# Patient Record
Sex: Male | Born: 1985
Health system: Southern US, Community
[De-identification: ages and names within clinical notes are randomized; demographics above are authoritative.]

## PROBLEM LIST (undated history)

## (undated) DIAGNOSIS — M899 Disorder of bone, unspecified: Secondary | ICD-10-CM

## (undated) HISTORY — DX: Disorder of bone, unspecified: M89.9

---

## 2014-07-22 ENCOUNTER — Ambulatory Visit (INDEPENDENT_AMBULATORY_CARE_PROVIDER_SITE_OTHER): Payer: BC Managed Care – PPO | Admitting: Family

## 2014-07-22 ENCOUNTER — Encounter: Payer: Self-pay | Admitting: Family

## 2014-07-22 VITALS — BP 118/78 | HR 57 | Temp 97.7°F | Resp 18 | Ht 72.0 in | Wt 189.0 lb

## 2014-07-22 DIAGNOSIS — Z0000 Encounter for general adult medical examination without abnormal findings: Secondary | ICD-10-CM

## 2014-07-22 DIAGNOSIS — Z23 Encounter for immunization: Secondary | ICD-10-CM | POA: Diagnosis not present

## 2014-07-22 NOTE — Patient Instructions (Signed)
Thank you for choosing [facility].  Summary/Instructions:   You have completed your annual physical.  Please stop by the lab when fasting for blood work.  Please schedule a physical for 1 year.   Return sooner if needed

## 2014-07-22 NOTE — Assessment & Plan Note (Signed)
1) Anticipatory Guidance: Discussed importance of changing batteries in smoke detector; wearing suntan lotion when being outside, wearing a seatbelt in the car and not texting while driving.  2) Immunizations / Screenings / Labs:  Flu shot given. All other immunizations are up to date / all screening are up to date / Ordered CBC, BMET, Lipid profile and TSH.   Normal well exam. Follow up in 1 year for next exam or sooner if needed.

## 2014-07-22 NOTE — Progress Notes (Signed)
Subjective:    Patient ID: Richard Cantrell Corea, male    DOB: 27-May-1986, 27 y.o.   MRN: [id number]  No chief complaint on file.   HPI:  Richard Cantrell Levey is a 28 y.o. male who presents today for to establish care and for a physical.  1) Health maintenance  Dental -- Has not been since 2011 [facility] -- Does not recall Immunizations -- Last tetanus shot was this year; would like flu shot; all childhood immunizations are up to date.  Diet - Tries to stay balanced with fruits and vegetables and protein. Eats 3 meals per day.   Allergies  Allergen Reactions  . Amoxicillin Hives   No current outpatient prescriptions on file prior to visit.   No current facility-administered medications on file prior to visit.   Family History  Problem Relation Age of Onset  . Heart attack Father   . Hypertension Father   . Diabetes Maternal Grandfather   . Prostate cancer Maternal Grandfather   . Diabetes Paternal Grandmother   . Heart attack Paternal Grandfather    History   Social History  . Marital Status: Single    Spouse Name: N/A    Number of Children: 0  . Years of Education: 17   Occupational History  . Graduate Assistantship Other    [employer]   Social History Main Topics  . Smoking status: Never Smoker   . Smokeless tobacco: Never Used  . Alcohol Use: Yes     Comment: infrequently  . Drug Use: No  . Sexual Activity: Not on file   Other Topics Concern  . Not on file   Social History Narrative   [place] Lives in an apartment by self. No pets.   Fun: Backpack and camp   Denies any religious beliefs effecting healthcare.     Review of Systems  Constitutional: Denies fever, chills, fatigue, or significant weight gain/loss. HENT: Head: Denies headache or neck pain Ears: Denies changes in hearing, ringing in ears, earache, drainage Nose: Denies discharge, stuffiness, itching, nosebleed, sinus pain Throat: Denies sore throat,  hoarseness, dry mouth, sores, thrush Eyes: Denies loss/changes in vision, pain, redness, blurry/double vision, flashing lights Cardiovascular: Denies chest pain/discomfort, tightness, palpitations, shortness of breath with activity, difficulty lying down, swelling, sudden awakening with shortness of breath Respiratory: Denies shortness of breath, cough, sputum production, wheezing Gastrointestinal: Denies dysphasia, heartburn, change in appetite, nausea, change in bowel habits, rectal bleeding, constipation, diarrhea, yellow skin or eyes Genitourinary: Denies frequency, urgency, burning/pain, blood in urine, incontinence, change in urinary strength. Musculoskeletal: Denies muscle/joint pain, stiffness, back pain, redness or swelling of joints, trauma Shoulder issue for the past few months. Unaware of any trauma - believes it could be strain.  Skin: Denies rashes, lumps, itching, dryness, color changes, or hair/nail changes Neurological: Denies dizziness, fainting, seizures, weakness, numbness, tingling, tremor Psychiatric - Denies nervousness, stress, depression or memory loss    Objective:    BP 118/78  Pulse 57  Temp(Src) 97.7 F (36.5 C)  Resp 18  Ht 6' (1.829 m)  Wt 189 lb (85.73 kg)  BMI 25.63 kg/m2  SpO2 98% Nursing note and vital signs reviewed.  Physical Exam  Constitutional: He is oriented to person, place, and time. He appears well-developed and well-nourished. No distress.  Eyes: Conjunctivae and EOM are normal. Pupils are equal, round, and reactive to light.  Neck: Normal range of motion. Neck supple. No JVD present. No tracheal deviation present. No thyromegaly present.  Cardiovascular: Normal rate, normal heart sounds and intact distal pulses.   Pulmonary/Chest: Effort normal and breath sounds normal.  Abdominal: Soft. Bowel sounds are normal. He exhibits no distension and no mass. There is no tenderness. There is no rebound and no guarding.  Musculoskeletal: Normal  range of motion.  Lymphadenopathy:    He has no cervical adenopathy.  Neurological: He is alert and oriented to person, place, and time. He has normal reflexes. No cranial nerve deficit. Coordination normal.  Skin: Skin is warm and dry.  Psychiatric: He has a normal mood and affect. His behavior is normal. Judgment and thought content normal.       Assessment & Plan:

## 2014-08-15 ENCOUNTER — Telehealth: Payer: Self-pay | Admitting: Family

## 2014-08-15 ENCOUNTER — Other Ambulatory Visit (INDEPENDENT_AMBULATORY_CARE_PROVIDER_SITE_OTHER): Payer: BC Managed Care – PPO

## 2014-08-15 DIAGNOSIS — Z0000 Encounter for general adult medical examination without abnormal findings: Secondary | ICD-10-CM

## 2014-08-15 LAB — BASIC METABOLIC PANEL
BUN: 24 mg/dL — ABNORMAL HIGH (ref 6–23)
CO2: 30 mEq/L (ref 19–32)
Calcium: 9.8 mg/dL (ref 8.4–10.5)
Chloride: 103 mEq/L (ref 96–112)
Creatinine, Ser: 1.3 mg/dL (ref 0.4–1.5)
GFR: 73.45 mL/min (ref >60.00)
Glucose, Bld: 95 mg/dL (ref 70–99)
Potassium: 4.3 mEq/L (ref 3.5–5.1)
Sodium: 139 mEq/L (ref 135–145)

## 2014-08-15 LAB — LIPID PANEL
Cholesterol: 134 mg/dL (ref 0–200)
HDL: 49 mg/dL (ref >39.00)
LDL Cholesterol: 71 mg/dL (ref 0–99)
NonHDL: 85
Total CHOL/HDL Ratio: 3
Triglycerides: 70 mg/dL (ref 0.0–149.0)
VLDL: 14 mg/dL (ref 0.0–40.0)

## 2014-08-15 LAB — CBC
HCT: 46 % (ref 39.0–52.0)
Hemoglobin: 15.3 g/dL (ref 13.0–17.0)
MCHC: 33.2 g/dL (ref 30.0–36.0)
MCV: 90.9 fl (ref 78.0–100.0)
Platelets: 166 10*3/uL (ref 150.0–400.0)
RBC: 5.07 Mil/uL (ref 4.22–5.81)
RDW: 13.1 % (ref 11.5–15.5)
WBC: 4.9 10*3/uL (ref 4.0–10.5)

## 2014-08-15 LAB — TSH: TSH: 3.41 u[IU]/mL (ref 0.35–4.50)

## 2014-08-15 NOTE — Telephone Encounter (Signed)
Called pt to let him know that labs were normal. Pt understood.

## 2014-08-15 NOTE — Telephone Encounter (Signed)
Please call patient and inform him that his labs are all within the expected ranges. There are no changes that need to be made at this time.

## 2015-02-13 ENCOUNTER — Encounter: Payer: Self-pay | Admitting: Family

## 2015-02-13 ENCOUNTER — Ambulatory Visit (INDEPENDENT_AMBULATORY_CARE_PROVIDER_SITE_OTHER): Payer: BLUE CROSS/BLUE SHIELD | Admitting: Family

## 2015-02-13 VITALS — BP 132/72 | HR 82 | Temp 97.8°F | Resp 18 | Ht 72.0 in | Wt 191.0 lb

## 2015-02-13 DIAGNOSIS — M25512 Pain in left shoulder: Secondary | ICD-10-CM | POA: Diagnosis not present

## 2015-02-13 NOTE — Patient Instructions (Signed)
Thank you for choosing [facility].  Summary/Instructions:   Please stop by radiology on the basement level of the building for your x-rays. Your results will be released to MyChart (or called to you) after review, usually within 72 hours after test completion. If any treatments or changes are necessary, you will be notified at that same time.  Referrals have been made during this visit. You should expect to hear back from our schedulers in about 7-10 days in regards to establishing an appointment with the specialists we discussed.   If your symptoms worsen or fail to improve, please contact our office for further instruction, or in case of emergency go directly to the emergency room at the closest medical facility.   Ice as needed.  Continue ibuprofen as needed.   Follow up if symptoms worsen.

## 2015-02-13 NOTE — Assessment & Plan Note (Signed)
Left shoulder discomfort and occasional upper extremity numbness of undetermined cause. Obtain cervical x-rays to rule out cervical spine nerve entrapment. Continue conservative treatment with ice, stretching, and over-the-counter ibuprofen as needed for discomfort. Refer to sports medicine for further evaluation.

## 2015-02-13 NOTE — Progress Notes (Signed)
Pre visit review using our clinic review tool, if applicable. No additional management support is needed unless otherwise documented below in the visit note. 

## 2015-02-13 NOTE — Progress Notes (Signed)
   Subjective:    Patient ID: Richard Cantrell, male    DOB: 04/21/1986, 29 y.o.   MRN: [id number]  Chief Complaint  Patient presents with  . Shoulder Pain    left shoulder pain x1 year and a half, doesn't remember doing anything strenuous to cause it, restricted ROM    HPI:  Richard Cantrell is a 29 y.o. male with no significant PMH who presents today for an acute office visit.  Associated symptom of pain and limited range of motion located in his left shoulder has been going on for approximately 1-1/2 years. Denies trauma or any significant event that started his pain. Describes that it feels like the movement feels restricted. Symptoms are exacerbated after approximately 15-20 minutes of upper extremity resistance training indicating that he feels weaker and sometimes numb and tingling. Severity is 0/10 and is not enough to effect his current lifestyle. Modifying factors include ibuprofen which does not help a lot.   Allergies  Allergen Reactions  . Amoxicillin Hives    No current outpatient prescriptions on file prior to visit.   No current facility-administered medications on file prior to visit.    Review of Systems  Constitutional: Negative for fever and chills.  Musculoskeletal:       Positive for shoulder discomfort.   Neurological: Positive for numbness.      Objective:    BP 132/72 mmHg  Pulse 82  Temp(Src) 97.8 F (36.6 C) (Oral)  Resp 18  Ht 6' (1.829 m)  Wt 191 lb (86.637 kg)  BMI 25.90 kg/m2  SpO2 99% Nursing note and vital signs reviewed.  Physical Exam  Constitutional: He is oriented to person, place, and time. He appears well-developed and well-nourished. No distress.  Cardiovascular: Normal rate, regular rhythm, normal heart sounds and intact distal pulses.   Pulmonary/Chest: Effort normal and breath sounds normal.  Musculoskeletal:  No obvious deformity, discoloration, or edema of left shoulder noted. Mild tenderness elicited anterior to  supraspinatus insertion. Displays full range of motion in all directions. Apley scratch test is negative. Apprehension is negative. Empty can is negative. Distal pulses and sensation are intact and appropriate.  Neurological: He is alert and oriented to person, place, and time.  Skin: Skin is warm and dry.  Psychiatric: He has a normal mood and affect. His behavior is normal. Judgment and thought content normal.       Assessment & Plan:

## 2015-02-25 ENCOUNTER — Ambulatory Visit (INDEPENDENT_AMBULATORY_CARE_PROVIDER_SITE_OTHER): Payer: BLUE CROSS/BLUE SHIELD | Admitting: Family Medicine

## 2015-02-25 ENCOUNTER — Ambulatory Visit (INDEPENDENT_AMBULATORY_CARE_PROVIDER_SITE_OTHER): Payer: BLUE CROSS/BLUE SHIELD

## 2015-02-25 ENCOUNTER — Encounter: Payer: Self-pay | Admitting: Family Medicine

## 2015-02-25 VITALS — BP 112/72 | HR 67 | Ht 72.0 in | Wt 188.0 lb

## 2015-02-25 DIAGNOSIS — M25512 Pain in left shoulder: Secondary | ICD-10-CM

## 2015-02-25 DIAGNOSIS — M67912 Unspecified disorder of synovium and tendon, left shoulder: Secondary | ICD-10-CM

## 2015-02-25 DIAGNOSIS — M67919 Unspecified disorder of synovium and tendon, unspecified shoulder: Secondary | ICD-10-CM | POA: Insufficient documentation

## 2015-02-25 MED ORDER — MELOXICAM 15 MG PO TABS: 15.0000 mg | ORAL_TABLET | Freq: Every day | ORAL | Status: DC

## 2015-02-25 NOTE — Assessment & Plan Note (Signed)
Patient does have more of a tendinopathy noted of the rotator cuff. Patient work with Event organiserathletic trainer today to learn home exercises in greater detail. Patient is going try conservative therapy with exercises, icing, topical anti-inflammatory's as well as a short course of oral anti-rheumatoid. Patient and will come back and see me again in 3-4 weeks. If any worsening symptoms he'll come back sooner and do not feel that further imaging is necessary at this time.

## 2015-02-25 NOTE — Progress Notes (Signed)
[facility] [address] [place], [place] [address] Phone: [phone] Subjective:    I'm seeing this patient by the request  of:  Jeanine Luz, FNP   CC: Left shoulder pain  BJY:NWGNFAOZHY Richard Cantrell is a 29 y.o. male coming in with complaint of left shoulder pain. Patient states he is had this insidious onset over the course of several weeks. Patient does not remember any true injury. Patient does work out on a regular basis. Patient denies any radiation of pain and states that it seems to be localized more on the posterior lateral aspect of the shoulder. Hurts more with overhead activity as well as reaching behind his back. Denies any significant weakness. Denies any nighttime awakening. Patient is concerned because it has not gotten any better and if anything is worsening. Has tried over-the-counter anti-inflammatory's with minimal benefit. States that this can sometimes give him pain that this is very mild rates the severity of pain a 6 out of 10. Nothing that stops him from his daily activities.    No past medical history on file. No past surgical history on file. History  Substance Use Topics  . Smoking status: Never Smoker   . Smokeless tobacco: Never Used  . Alcohol Use: Yes     Comment: infrequently   Allergies  Allergen Reactions  . Amoxicillin Hives   Family History  Problem Relation Age of Onset  . Heart attack Father   . Hypertension Father   . Diabetes Maternal Grandfather   . Prostate cancer Maternal Grandfather   . Diabetes Paternal Grandmother   . Heart attack Paternal Grandfather      Past medical history, social, surgical and family history all reviewed in electronic medical record.   Review of Systems: No headache, visual changes, nausea, vomiting, diarrhea, constipation, dizziness, abdominal pain, skin rash, fevers, chills, night sweats, weight loss, swollen lymph nodes, body aches, joint swelling, muscle aches,  chest pain, shortness of breath, mood changes.   Objective Blood pressure 112/72, pulse 67, height 6' (1.829 m), weight 188 lb (85.276 kg), SpO2 99 %.  General: No apparent distress alert and oriented x3 mood and affect normal, dressed appropriately.  HEENT: Pupils equal, extraocular movements intact  Respiratory: Patient's speak in full sentences and does not appear short of breath  Cardiovascular: No lower extremity edema, non tender, no erythema  Skin: Warm dry intact with no signs of infection or rash on extremities or on axial skeleton.  Abdomen: Soft nontender  Neuro: Cranial nerves II through XII are intact, neurovascularly intact in all extremities with 2+ DTRs and 2+ pulses.  Lymph: No lymphadenopathy of posterior or anterior cervical chain or axillae bilaterally.  Gait normal with good balance and coordination.  MSK:  Non tender with full range of motion and good stability and symmetric strength and tone of  elbows, wrist, hip, knee and ankles bilaterally.  Shoulder: left Inspection reveals no abnormalities, atrophy or asymmetry. Palpation is normal with no tenderness over AC joint or bicipital groove. ROM is full in all planes passively. Rotator cuff strength normal throughout. signs of impingement with positive Neer and Hawkin's tests, but negative empty can sign. Speeds and Yergason's tests normal. No labral pathology noted with negative Obrien's, negative clunk and good stability. Normal scapular function observed. No painful arc and no drop arm sign. No apprehension sign  MSK US performed of: left This study was ordered, performed, and interpreted by Terrilee Files D.O.  Shoulder:   Supraspinatus:  Appears normal on long and transverse views, Bursal bulge seen with shoulder abduction on impingement view. Increasing Doppler flow at insertion of the rotator cuff Infraspinatus:  Appears normal on long and transverse views. Significant increase in Doppler flow Subscapularis:   Appears normal on long and transverse views. Positive bursa Teres Minor:  Appears normal on long and transverse views. AC joint:  Capsule undistended, no geyser sign. Glenohumeral Joint:  Appears normal without effusion. Glenoid Labrum:  Intact without visualized tears. Biceps Tendon:  Appears normal on long and transverse views, no fraying of tendon, tendon located in intertubercular groove, no subluxation with shoulder internal or external rotation.  Impression: Mild Subacromial bursitis with rotator cuff tendinopathy   Procedure note 97110; 15 minutes spent for Therapeutic exercises as stated in above notes.  This included exercises focusing on stretching, strengthening, with significant focus on eccentric aspects.  - Shoulder Exercises that included:  Basic scapular stabilization to include adduction and depression of scapula Scaption, focusing on proper movement and good control Internal and External rotation utilizing a theraband, with elbow tucked at side entire time Rows with theraband Proper technique shown and discussed handout in great detail with ATC.  All questions were discussed and answered.      Impression and Recommendations:     This case required medical decision making of moderate complexity.

## 2015-02-25 NOTE — Progress Notes (Signed)
Pre visit review using our clinic review tool, if applicable. No additional management support is needed unless otherwise documented below in the visit note. 

## 2015-02-25 NOTE — Patient Instructions (Signed)
Good to see you Ice 20 minutes 2 times daily. Usually after activity and before bed. Exercises 3 times a week.  pennsaid pinkie amount topically 2 times daily as needed.  Meloxicam daily for 10 days then as needed Avoid overhead lifting until I see you again Tennis ball between shoulder blades with a lot of sitting See me again in 3 weeks.

## 2015-03-18 ENCOUNTER — Ambulatory Visit (INDEPENDENT_AMBULATORY_CARE_PROVIDER_SITE_OTHER): Payer: BLUE CROSS/BLUE SHIELD | Admitting: Family Medicine

## 2015-03-18 ENCOUNTER — Other Ambulatory Visit (INDEPENDENT_AMBULATORY_CARE_PROVIDER_SITE_OTHER): Payer: BLUE CROSS/BLUE SHIELD

## 2015-03-18 ENCOUNTER — Encounter: Payer: Self-pay | Admitting: Family Medicine

## 2015-03-18 ENCOUNTER — Ambulatory Visit (INDEPENDENT_AMBULATORY_CARE_PROVIDER_SITE_OTHER)
Admission: RE | Admit: 2015-03-18 | Discharge: 2015-03-18 | Disposition: A | Discharge status: Home / Self Care | Payer: BLUE CROSS/BLUE SHIELD | Source: Ambulatory Visit | Attending: Family Medicine | Admitting: Family Medicine

## 2015-03-18 VITALS — BP 106/72 | HR 70 | Wt 188.0 lb

## 2015-03-18 DIAGNOSIS — M25512 Pain in left shoulder: Secondary | ICD-10-CM

## 2015-03-18 DIAGNOSIS — M67912 Unspecified disorder of synovium and tendon, left shoulder: Secondary | ICD-10-CM

## 2015-03-18 NOTE — Assessment & Plan Note (Addendum)
Patient was given an injection today. We discussed icing regimen and home exercises. We discussed what activities to potentially avoid. Patient will actually call back in one week and if continuing have pain I would consider an MRI due to the worsening symptoms and failing conservative therapy.  Spent  25 minutes with patient face-to-face and had greater than 50% of counseling including as described above in assessment and plan.

## 2015-03-18 NOTE — Patient Instructions (Signed)
Good to see you Sorry for no improvement Lets get xray downstairs today Continue the exercises regularly Avoid lifting where hands are out of periphery or overhead.  Give it 7-10 days if not a lot better call and we will order MRI.  Then I would want to see you 1-2 days after.

## 2015-03-18 NOTE — Progress Notes (Signed)
[facility] [address] [place], [place] [address] Phone: [phone] Subjective:    I'm seeing this patient by the request  of:  Jeanine Luz, FNP   CC: Left shoulder pain  BJY:NWGNFAOZHY Richard Cantrell is a 29 y.o. male coming in with complaint of left shoulder pain. She was found to have a very minimal subacromial bursitis as well as a tendinopathy of the rotator cuff. There is no signs of any and internal or intra-articular derangement. Patient was given home exercises, icing protocol, and oral anti-inflammatory's. Patient states that he is not made any significant improvement at this time. An states if anything it seems to be worsening. States that he is only lifting weights one time a week. States that he can be uncomfortable and falling asleep at night as well. Noticing more popping than can be somewhat uncomfortable but would not consider painful.    No past medical history on file. No past surgical history on file. History  Substance Use Topics  . Smoking status: Never Smoker   . Smokeless tobacco: Never Used  . Alcohol Use: Yes     Comment: infrequently   Allergies  Allergen Reactions  . Amoxicillin Hives   Family History  Problem Relation Age of Onset  . Heart attack Father   . Hypertension Father   . Diabetes Maternal Grandfather   . Prostate cancer Maternal Grandfather   . Diabetes Paternal Grandmother   . Heart attack Paternal Grandfather      Past medical history, social, surgical and family history all reviewed in electronic medical record.   Review of Systems: No headache, visual changes, nausea, vomiting, diarrhea, constipation, dizziness, abdominal pain, skin rash, fevers, chills, night sweats, weight loss, swollen lymph nodes, body aches, joint swelling, muscle aches, chest pain, shortness of breath, mood changes.   Objective Blood pressure 106/72, pulse 70, weight 188 lb (85.276 kg), SpO2 98 %.  General: No  apparent distress alert and oriented x3 mood and affect normal, dressed appropriately.  HEENT: Pupils equal, extraocular movements intact  Respiratory: Patient's speak in full sentences and does not appear short of breath  Cardiovascular: No lower extremity edema, non tender, no erythema  Skin: Warm dry intact with no signs of infection or rash on extremities or on axial skeleton.  Abdomen: Soft nontender  Neuro: Cranial nerves II through XII are intact, neurovascularly intact in all extremities with 2+ DTRs and 2+ pulses.  Lymph: No lymphadenopathy of posterior or anterior cervical chain or axillae bilaterally.  Gait normal with good balance and coordination.  MSK:  Non tender with full range of motion and good stability and symmetric strength and tone of  elbows, wrist, hip, knee and ankles bilaterally.  Shoulder: left Inspection reveals no abnormalities, atrophy or asymmetry. Palpation is normal with no tenderness over AC joint or bicipital groove. ROM is full in all planes passively. Rotator cuff strength normal throughout. signs of impingement with positive Neer and Hawkin's tests, but negative empty can sign. Speeds and Yergason's tests normal. No labral pathology noted with negative Obrien's, negative clunk and good stability. Normal scapular function observed. No painful arc and no drop arm sign. No apprehension sign No change from previous exam Contralateral shoulder unremarkable  Procedure: Real-time Ultrasound Guided Injection of left glenohumeral joint Device: GE Logiq E  Ultrasound guided injection is preferred based studies that show increased duration, increased effect, greater accuracy, decreased procedural pain, increased response rate with ultrasound guided versus blind injection.  Verbal informed  consent obtained.  Time-out conducted.  Noted no overlying erythema, induration, or other signs of local infection.  Skin prepped in a sterile fashion.  Local anesthesia:  Topical Ethyl chloride.  With sterile technique and under real time ultrasound guidance:  Joint visualized.  23g 1  inch needle inserted posterior approach. Pictures taken for needle placement. Patient did have injection of 2 cc of 1% lidocaine, 2 cc of 0.5% Marcaine, and 1cc of Kenalog 40 mg/dL. Completed without difficulty  Pain immediately resolved suggesting accurate placement of the medication.  Advised to call if fevers/chills, erythema, induration, drainage, or persistent bleeding.  Images permanently stored and available for review in the ultrasound unit.  Impression: Technically successful ultrasound guided injection.      Impression and Recommendations:     This case required medical decision making of moderate complexity.

## 2015-03-27 ENCOUNTER — Telehealth: Payer: Self-pay | Admitting: Family Medicine

## 2015-03-27 NOTE — Telephone Encounter (Signed)
Please call patient with his xray results

## 2015-03-27 NOTE — Telephone Encounter (Signed)
Left detailed msg on pt's vmail advising him x ray results are normal.

## 2015-04-02 ENCOUNTER — Telehealth: Payer: Self-pay | Admitting: Family

## 2015-04-02 DIAGNOSIS — M25512 Pain in left shoulder: Secondary | ICD-10-CM

## 2015-04-02 NOTE — Telephone Encounter (Signed)
Please call patient. He still is not feeling any better

## 2015-04-02 NOTE — Telephone Encounter (Signed)
Spoke to pt, entered or for MR Arthrogram.

## 2015-04-15 ENCOUNTER — Ambulatory Visit
Admission: RE | Admit: 2015-04-15 | Discharge: 2015-04-15 | Disposition: A | Discharge status: Home / Self Care | Payer: BLUE CROSS/BLUE SHIELD | Source: Ambulatory Visit | Attending: Family Medicine | Admitting: Family Medicine

## 2015-04-15 DIAGNOSIS — M25512 Pain in left shoulder: Secondary | ICD-10-CM

## 2015-04-15 MED ORDER — IOHEXOL 180 MG/ML  SOLN: 15.0000 mL | Freq: Once | INTRAMUSCULAR | Status: DC | PRN

## 2015-04-15 MED ORDER — IOHEXOL 180 MG/ML  SOLN
15.0000 mL | Freq: Once | INTRAMUSCULAR | Status: AC | PRN
  Administered 2015-04-15: 15 mL via INTRA_ARTICULAR

## 2015-04-30 ENCOUNTER — Ambulatory Visit (INDEPENDENT_AMBULATORY_CARE_PROVIDER_SITE_OTHER): Payer: BLUE CROSS/BLUE SHIELD | Admitting: Family Medicine

## 2015-04-30 ENCOUNTER — Encounter: Payer: Self-pay | Admitting: Family Medicine

## 2015-04-30 VITALS — BP 114/82 | HR 60 | Ht 72.0 in | Wt 187.0 lb

## 2015-04-30 DIAGNOSIS — M9908 Segmental and somatic dysfunction of rib cage: Secondary | ICD-10-CM | POA: Diagnosis not present

## 2015-04-30 DIAGNOSIS — M899 Disorder of bone, unspecified: Secondary | ICD-10-CM | POA: Diagnosis not present

## 2015-04-30 DIAGNOSIS — M9902 Segmental and somatic dysfunction of thoracic region: Secondary | ICD-10-CM | POA: Diagnosis not present

## 2015-04-30 DIAGNOSIS — M9901 Segmental and somatic dysfunction of cervical region: Secondary | ICD-10-CM

## 2015-04-30 DIAGNOSIS — M999 Biomechanical lesion, unspecified: Secondary | ICD-10-CM | POA: Insufficient documentation

## 2015-04-30 NOTE — Progress Notes (Signed)
[facility] [address] [place], [place] [address] Phone: [phone] Subjective:    I'm seeing this patient by the request  of:  Jeanine Luz, FNP   CC: Left shoulder pain  BJY:NWGNFAOZHY Richard Cantrell is a 29 y.o. male coming in with complaint of left shoulder pain. She was found to have a very minimal subacromial bursitis as well as a tendinopathy of the rotator cuff seen on ultrasound. Patient's elected to try an injection which did not give him any significant relief. We ordered an MRI. MRI was reviewed by me and was completely unremarkable. Patient states he continues to have the same pain. Seems to still be in the left shoulder region. Patient states that it is still very uncomfortable. Still able to do daily activities but is very difficulty to follow sleep at night because of this pain. Denies any significant radiation or weakness of the upper extremity. Denies any associated neck pain.    No past medical history on file. No past surgical history on file. History  Substance Use Topics  . Smoking status: Never Smoker   . Smokeless tobacco: Never Used  . Alcohol Use: Yes     Comment: infrequently   Allergies  Allergen Reactions  . Amoxicillin Hives   Family History  Problem Relation Age of Onset  . Heart attack Father   . Hypertension Father   . Diabetes Maternal Grandfather   . Prostate cancer Maternal Grandfather   . Diabetes Paternal Grandmother   . Heart attack Paternal Grandfather      Past medical history, social, surgical and family history all reviewed in electronic medical record.   Review of Systems: No headache, visual changes, nausea, vomiting, diarrhea, constipation, dizziness, abdominal pain, skin rash, fevers, chills, night sweats, weight loss, swollen lymph nodes, body aches, joint swelling, muscle aches, chest pain, shortness of breath, mood changes.   Objective Blood pressure 114/82, pulse 60, height 6' (1.829  m), weight 187 lb (84.823 kg), SpO2 98 %.  General: No apparent distress alert and oriented x3 mood and affect normal, dressed appropriately.  HEENT: Pupils equal, extraocular movements intact  Respiratory: Patient's speak in full sentences and does not appear short of breath  Cardiovascular: No lower extremity edema, non tender, no erythema  Skin: Warm dry intact with no signs of infection or rash on extremities or on axial skeleton.  Abdomen: Soft nontender  Neuro: Cranial nerves II through XII are intact, neurovascularly intact in all extremities with 2+ DTRs and 2+ pulses.  Lymph: No lymphadenopathy of posterior or anterior cervical chain or axillae bilaterally.  Gait normal with good balance and coordination.  MSK:  Non tender with full range of motion and good stability and symmetric strength and tone of  elbows, wrist, hip, knee and ankles bilaterally.  Shoulder: left Inspection reveals no abnormalities, atrophy or asymmetry. Palpation is normal with no tenderness over AC joint or bicipital groove. ROM is full in all planes passively. Rotator cuff strength normal throughout. Mild impingement sign still. Speeds and Yergason's tests normal. No labral pathology noted with negative Obrien's, negative clunk and good stability. Scapula does have some mild dysfunction No painful arc and no drop arm sign. No apprehension sign  Contralateral shoulder unremarkable Osteopathic findings C6 flexed rotated and side bent left T2 extended rotated and side bent left with inhaled second rib T5 extended rotated and side bent right L2 flexed rotated inside that right Sacrum left on left    Impression and Recommendations:  This case required medical decision making of moderate complexity.

## 2015-04-30 NOTE — Assessment & Plan Note (Signed)
Patient does have some dysfunction of the scapula noted today. We did try osteopathic manipulation as well today. Patient will likely do better overall. Patient did feel better after the osteopathic manipulation. Patient will come back again in 3 weeks for further evaluation and treatment.

## 2015-04-30 NOTE — Assessment & Plan Note (Signed)
Decision today to treat with OMT was based on Physical Exam  After verbal consent patient was treated with HVLA, ME, FPR techniques in cervical, rib, thoracic areas  Patient tolerated the procedure well with improvement in symptoms  Patient given exercises, stretches and lifestyle modifications  See medications in patient instructions if given  Patient will follow up in 3 weeks

## 2015-04-30 NOTE — Patient Instructions (Signed)
Good to see you Richard Cantrell is your friend.  New exercises on the scapula Watch sleeping position for now.  See me again in 3 weeks Standing:  Secure a rubber exercise band/tubing so that it is at the height of your shoulders when you are either standing or sitting on a firm arm-less chair.  Grasp an end of the band/tubing in each hand and have your palms face each other. Straighten your elbows and lift your hands straight in front of you at shoulder height. Step back away from the secured end of band/tubing until it becomes tense.  Squeeze your shoulder blades together. Keeping your elbows locked and your hands at shoulder-height, bring your hands out to your side.  Hold __________ seconds. Slowly ease the tension on the band/tubing as you reverse the directions and return to the starting position. Repeat __________ times. Complete this exercise __________ times per day. STRENGTH - Scapular Retractors  Secure a rubber exercise band/tubing so that it is at the height of your shoulders when you are either standing or sitting on a firm arm-less chair.  With a palm-down grip, grasp an end of the band/tubing in each hand. Straighten your elbows and lift your hands straight in front of you at shoulder height. Step back away from the secured end of band/tubing until it becomes tense.  Squeezing your shoulder blades together, draw your elbows back as you bend them. Keep your upper arm lifted away from your body throughout the exercise.  Hold __________ seconds. Slowly ease the tension on the band/tubing as you reverse the directions and return to the starting position. Repeat __________ times. Complete this exercise __________ times per day. STRENGTH - Shoulder Extensors   Secure a rubber exercise band/tubing so that it is at the height of your shoulders when you are either standing or sitting on a firm arm-less chair.  With a thumbs-up grip, grasp an end of the band/tubing in each hand. Straighten  your elbows and lift your hands straight in front of you at shoulder height. Step back away from the secured end of band/tubing until it becomes tense.  Squeezing your shoulder blades together, pull your hands down to the sides of your thighs. Do not allow your hands to go behind you.  Hold for __________ seconds. Slowly ease the tension on the band/tubing as you reverse the directions and return to the starting position. Repeat __________ times. Complete this exercise __________ times per day.  STRENGTH - Scapular Retractors and External Rotators  Secure a rubber exercise band/tubing so that it is at the height of your shoulders when you are either standing or sitting on a firm arm-less chair.  With a palm-down grip, grasp an end of the band/tubing in each hand. Bend your elbows 90 degrees and lift your elbows to shoulder height at your sides. Step back away from the secured end of band/tubing until it becomes tense.  Squeezing your shoulder blades together, rotate your shoulder so that your upper arm and elbow remain stationary, but your fists travel upward to head-height.  Hold __________ for seconds. Slowly ease the tension on the band/tubing as you reverse the directions and return to the starting position. Repeat __________ times. Complete this exercise __________ times per day.  STRENGTH - Scapular Retractors and External Rotators, Rowing  Secure a rubber exercise band/tubing so that it is at the height of your shoulders when you are either standing or sitting on a firm arm-less chair.  With a palm-down grip, grasp an  end of the band/tubing in each hand. Straighten your elbows and lift your hands straight in front of you at shoulder height. Step back away from the secured end of band/tubing until it becomes tense.  Step 1: Squeeze your shoulder blades together. Bending your elbows, draw your hands to your chest as if you are rowing a boat. At the end of this motion, your hands and elbow  should be at shoulder-height and your elbows should be out to your sides.  Step 2: Rotate your shoulder to raise your hands above your head. Your forearms should be vertical and your upper-arms should be horizontal.  Hold for __________ seconds. Slowly ease the tension on the band/tubing as you reverse the directions and return to the starting position. Repeat __________ times. Complete this exercise __________ times per day.  STRENGTH - Scapular Retractors and Elevators  Secure a rubber exercise band/tubing so that it is at the height of your shoulders when you are either standing or sitting on a firm arm-less chair.  With a thumbs-up grip, grasp an end of the band/tubing in each hand. Step back away from the secured end of band/tubing until it becomes tense.  Squeezing your shoulder blades together, straighten your elbows and lift your hands straight over your head.  Hold for __________ seconds. Slowly ease the tension on the band/tubing as you reverse the directions and return to the starting position. Repeat __________ times. Complete this exercise __________ times per day.  Document Released: 09/12/2005 Document Revised: 12/05/2011 Document Reviewed: 12/25/2008 [facility] Patient Information 2015 [place], [place]. This information is not intended to replace advice given to you by your health care provider. Make sure you discuss any questions you have with your health care provider.

## 2015-04-30 NOTE — Progress Notes (Signed)
Pre visit review using our clinic review tool, if applicable. No additional management support is needed unless otherwise documented below in the visit note. 

## 2015-05-19 ENCOUNTER — Ambulatory Visit (INDEPENDENT_AMBULATORY_CARE_PROVIDER_SITE_OTHER): Payer: BLUE CROSS/BLUE SHIELD | Admitting: Family Medicine

## 2015-05-19 ENCOUNTER — Encounter: Payer: Self-pay | Admitting: Family Medicine

## 2015-05-19 VITALS — BP 124/72 | HR 73 | Ht 72.0 in | Wt 187.0 lb

## 2015-05-19 DIAGNOSIS — M899 Disorder of bone, unspecified: Secondary | ICD-10-CM

## 2015-05-19 DIAGNOSIS — M9908 Segmental and somatic dysfunction of rib cage: Secondary | ICD-10-CM | POA: Diagnosis not present

## 2015-05-19 DIAGNOSIS — M9901 Segmental and somatic dysfunction of cervical region: Secondary | ICD-10-CM | POA: Diagnosis not present

## 2015-05-19 DIAGNOSIS — M9902 Segmental and somatic dysfunction of thoracic region: Secondary | ICD-10-CM

## 2015-05-19 DIAGNOSIS — M999 Biomechanical lesion, unspecified: Secondary | ICD-10-CM

## 2015-05-19 NOTE — Assessment & Plan Note (Signed)
Decision today to treat with OMT was based on Physical Exam  After verbal consent patient was treated with HVLA, ME, FPR techniques in cervical, rib, thoracic areas  Patient tolerated the procedure well with improvement in symptoms  Patient given exercises, stretches and lifestyle modifications  See medications in patient instructions if given  Patient will follow up in 3-4 weeks

## 2015-05-19 NOTE — Progress Notes (Signed)
[facility] [address] [place], [place] [address] Phone: [phone] Subjective:    I'm seeing this patient by the request  of:  Jeanine Luz, FNP   CC: Left shoulder pain follow up  BJY:NWGNFAOZHY Richard Cantrell is a 29 y.o. male coming in with complaint of left shoulder pain. He was found to have a very minimal subacromial bursitis as well as a tendinopathy of the rotator cuff seen on ultrasound. Patient's elected to try an injection which did not give him any significant relief. MRI left shoulder was unremarkable. Patient elected try osteopathic manipulation. Patient was given different home exercises and was to continue the same conservative therapy previously. Patient states overall the manipulation did help for a couple days and then seemed to go back to his own pattern. Patient continues to try to be active and avoid certain activities. Patient states he has not made significant improvement in frequency or severity of pain at this time.     No past medical history on file. No past surgical history on file. Social History  Substance Use Topics  . Smoking status: Never Smoker   . Smokeless tobacco: Never Used  . Alcohol Use: Yes     Comment: infrequently   Allergies  Allergen Reactions  . Amoxicillin Hives   Family History  Problem Relation Age of Onset  . Heart attack Father   . Hypertension Father   . Diabetes Maternal Grandfather   . Prostate cancer Maternal Grandfather   . Diabetes Paternal Grandmother   . Heart attack Paternal Grandfather      Past medical history, social, surgical and family history all reviewed in electronic medical record.   Review of Systems: No headache, visual changes, nausea, vomiting, diarrhea, constipation, dizziness, abdominal pain, skin rash, fevers, chills, night sweats, weight loss, swollen lymph nodes, body aches, joint swelling, muscle aches, chest pain, shortness of breath, mood changes.    Objective Blood pressure 124/72, pulse 73, height 6' (1.829 m), weight 187 lb (84.823 kg), SpO2 99 %.  General: No apparent distress alert and oriented x3 mood and affect normal, dressed appropriately.  HEENT: Pupils equal, extraocular movements intact  Respiratory: Patient's speak in full sentences and does not appear short of breath  Cardiovascular: No lower extremity edema, non tender, no erythema  Skin: Warm dry intact with no signs of infection or rash on extremities or on axial skeleton.  Abdomen: Soft nontender  Neuro: Cranial nerves II through XII are intact, neurovascularly intact in all extremities with 2+ DTRs and 2+ pulses.  Lymph: No lymphadenopathy of posterior or anterior cervical chain or axillae bilaterally.  Gait normal with good balance and coordination.  MSK:  Non tender with full range of motion and good stability and symmetric strength and tone of  elbows, wrist, hip, knee and ankles bilaterally.  Shoulder: left Inspection reveals no abnormalities, atrophy or asymmetry. Palpation is normal with no tenderness over AC joint or bicipital groove. Does have tightness of the left trapezius muscle ROM is full in all planes passively. Rotator cuff strength normal throughout. Mild impingement sign still remaining  Speeds and Yergason's tests normal. No labral pathology noted with negative Obrien's, negative clunk and good stability. Scapula does have some mild dysfunction No painful arc and no drop arm sign. No apprehension sign  Contralateral shoulder unremarkable Osteopathic findings C6 flexed rotated and side bent left T2 extended rotated and side bent left with inhaled second rib T5 extended rotated and side bent right L2  flexed rotated inside that right Sacrum left on left No significant change from previous exam   Impression and Recommendations:     This case required medical decision making of moderate complexity.

## 2015-05-19 NOTE — Patient Instructions (Addendum)
Good to see you Ice when you need it Continue to work on the upper back strength and I would still limit overhead activity I hope this one helps longer Vitamin D 2000 IU daily Turmeric  twice daily Fish oil 2 grams daily See me again in 3-4 weeks.

## 2015-05-19 NOTE — Progress Notes (Signed)
Pre visit review using our clinic review tool, if applicable. No additional management support is needed unless otherwise documented below in the visit note. 

## 2015-05-19 NOTE — Assessment & Plan Note (Signed)
Still believe that likely this is more scapular dysfunction. Patient did respond even better today to osteopathic manipulation and he did previously. We discussed the possibility of x-rays of the cervical spine to rule out any cervical radiculopathy that could be contributing. Patient encouraged to avoid significant overhead activities continue the home exercises that we've given him. Discussed over-the-counter natural supplementations. Patient will come back and see me again in 3-4 weeks for further evaluation and treatment.

## 2015-06-10 ENCOUNTER — Encounter: Payer: Self-pay | Admitting: Family Medicine

## 2015-06-10 ENCOUNTER — Ambulatory Visit (INDEPENDENT_AMBULATORY_CARE_PROVIDER_SITE_OTHER): Payer: BLUE CROSS/BLUE SHIELD | Admitting: Family Medicine

## 2015-06-10 VITALS — BP 110/64 | HR 54 | Ht 72.0 in | Wt 186.0 lb

## 2015-06-10 DIAGNOSIS — M9901 Segmental and somatic dysfunction of cervical region: Secondary | ICD-10-CM

## 2015-06-10 DIAGNOSIS — M999 Biomechanical lesion, unspecified: Secondary | ICD-10-CM

## 2015-06-10 DIAGNOSIS — M899 Disorder of bone, unspecified: Secondary | ICD-10-CM | POA: Diagnosis not present

## 2015-06-10 DIAGNOSIS — M9902 Segmental and somatic dysfunction of thoracic region: Secondary | ICD-10-CM | POA: Diagnosis not present

## 2015-06-10 DIAGNOSIS — M9908 Segmental and somatic dysfunction of rib cage: Secondary | ICD-10-CM | POA: Diagnosis not present

## 2015-06-10 NOTE — Patient Instructions (Signed)
Good to see you Ice is your friend Continue to do what you want.  Good luck with the conference.  Keep doing what you yare doing Get xray when you want.  See me again in 4-6 weeks.

## 2015-06-10 NOTE — Assessment & Plan Note (Signed)
Decision today to treat with OMT was based on Physical Exam  After verbal consent patient was treated with HVLA, ME, FPR techniques in cervical, rib, thoracic areas  Patient tolerated the procedure well with improvement in symptoms  Patient given exercises, stretches and lifestyle modifications  See medications in patient instructions if given  Patient will follow up in 4-5 weeks

## 2015-06-10 NOTE — Assessment & Plan Note (Signed)
I do believe that this is likely causing most of the intervening factor. MRI of shoulder previously was unremarkable. Patient will get x-rays of the neck when he feels like he has time. This would rule out any cervical radiculopathy that could be contributing. Patient has responded very well to osteopathic manipulation. Encourage him to do the exercises regularly in the icing protocol. Patient and will come back and see me again in 4-5 weeks for further evaluation and treatment.

## 2015-06-10 NOTE — Progress Notes (Signed)
  [facility] [address] [place], [place] [address] Phone: [phone] Subjective:    I'm seeing this patient by the request  of:  Jeanine Luz, FNP   CC: Left shoulder pain follow up  BJY:NWGNFAOZHY Richard Cantrell is a 29 y.o. male coming in with complaint of left shoulder pain. He was found to have a very minimal subacromial bursitis as well as a tendinopathy of the rotator cuff seen on ultrasound. Patient's elected to try an injection which did not give him any significant relief. MRI left shoulder was unremarkable. Patient has been responding fairly well to osteopathic manipulation. Patient states as long as he does the exercises and avoid certain activities he seems to do well. Still intermittent pain going down the arm but very seldom. Does think is making improvement.     No past medical history on file. No past surgical history on file. Social History  Substance Use Topics  . Smoking status: Never Smoker   . Smokeless tobacco: Never Used  . Alcohol Use: Yes     Comment: infrequently   Allergies  Allergen Reactions  . Amoxicillin Hives   Family History  Problem Relation Age of Onset  . Heart attack Father   . Hypertension Father   . Diabetes Maternal Grandfather   . Prostate cancer Maternal Grandfather   . Diabetes Paternal Grandmother   . Heart attack Paternal Grandfather      Past medical history, social, surgical and family history all reviewed in electronic medical record.   Review of Systems: No headache, visual changes, nausea, vomiting, diarrhea, constipation, dizziness, abdominal pain, skin rash, fevers, chills, night sweats, weight loss, swollen lymph nodes, body aches, joint swelling, muscle aches, chest pain, shortness of breath, mood changes.   Objective Blood pressure 110/64, pulse 54, height 6' (1.829 m), weight 186 lb (84.369 kg), SpO2 99 %.  General: No apparent distress alert and oriented x3 mood and affect  normal, dressed appropriately.  HEENT: Pupils equal, extraocular movements intact  Respiratory: Patient's speak in full sentences and does not appear short of breath  Cardiovascular: No lower extremity edema, non tender, no erythema  Skin: Warm dry intact with no signs of infection or rash on extremities or on axial skeleton.  Abdomen: Soft nontender  Neuro: Cranial nerves II through XII are intact, neurovascularly intact in all extremities with 2+ DTRs and 2+ pulses.  Lymph: No lymphadenopathy of posterior or anterior cervical chain or axillae bilaterally.  Gait normal with good balance and coordination.  MSK:  Non tender with full range of motion and good stability and symmetric strength and tone of  elbows, wrist, hip, knee and ankles bilaterally.  Shoulder: left Inspection reveals no abnormalities, atrophy or asymmetry. Palpation is normal with no tenderness over AC joint or bicipital groove.  Mild tightness trapezius Rotator cuff strength normal throughout. Minimal impingement Speeds and Yergason's tests normal. No labral pathology noted with negative Obrien's, negative clunk and good stability. Improving scapular dyskinesia No painful arc and no drop arm sign. No apprehension sign  Contralateral shoulder unremarkable Osteopathic findings C6 flexed rotated and side bent left T2 extended rotated and side bent left with inhaled second rib T5 extended rotated and side bent right L2 flexed rotated inside that right Sacrum left on left Same pattern as previously   Impression and Recommendations:     This case required medical decision making of moderate complexity.

## 2015-06-10 NOTE — Progress Notes (Signed)
Pre visit review using our clinic review tool, if applicable. No additional management support is needed unless otherwise documented below in the visit note. 

## 2015-07-14 ENCOUNTER — Ambulatory Visit (INDEPENDENT_AMBULATORY_CARE_PROVIDER_SITE_OTHER)
Admission: RE | Admit: 2015-07-14 | Discharge: 2015-07-14 | Disposition: A | Discharge status: Home / Self Care | Payer: BLUE CROSS/BLUE SHIELD | Source: Ambulatory Visit | Attending: Family | Admitting: Family

## 2015-07-14 ENCOUNTER — Encounter: Payer: Self-pay | Admitting: Family Medicine

## 2015-07-14 DIAGNOSIS — M25512 Pain in left shoulder: Secondary | ICD-10-CM | POA: Diagnosis not present

## 2015-07-22 ENCOUNTER — Encounter: Payer: Self-pay | Admitting: Family Medicine

## 2015-07-22 ENCOUNTER — Ambulatory Visit (INDEPENDENT_AMBULATORY_CARE_PROVIDER_SITE_OTHER): Payer: BLUE CROSS/BLUE SHIELD | Admitting: Family Medicine

## 2015-07-22 VITALS — BP 122/72 | HR 93 | Ht 72.0 in | Wt 188.0 lb

## 2015-07-22 DIAGNOSIS — M9901 Segmental and somatic dysfunction of cervical region: Secondary | ICD-10-CM

## 2015-07-22 DIAGNOSIS — M9902 Segmental and somatic dysfunction of thoracic region: Secondary | ICD-10-CM

## 2015-07-22 DIAGNOSIS — Z23 Encounter for immunization: Secondary | ICD-10-CM | POA: Diagnosis not present

## 2015-07-22 DIAGNOSIS — M899 Disorder of bone, unspecified: Secondary | ICD-10-CM | POA: Diagnosis not present

## 2015-07-22 DIAGNOSIS — M9908 Segmental and somatic dysfunction of rib cage: Secondary | ICD-10-CM | POA: Diagnosis not present

## 2015-07-22 DIAGNOSIS — M999 Biomechanical lesion, unspecified: Secondary | ICD-10-CM

## 2015-07-22 NOTE — Assessment & Plan Note (Signed)
Decision today to treat with OMT was based on Physical Exam  After verbal consent patient was treated with HVLA, ME, FPR techniques in cervical, rib, thoracic areas  Patient tolerated the procedure well with improvement in symptoms  Patient given exercises, stretches and lifestyle modifications  See medications in patient instructions if given  Patient will follow up in 4 weeks

## 2015-07-22 NOTE — Patient Instructions (Signed)
Good to see you Lets focus on the lats a little more.  Ice 20 minutes 2 times daily. Usually after activity and before bed. Posture posture posture When in [place]- [address] And [place].  Hold bar of door or something bend at waist 90 degrees and pull back.  Hold for 10 seconds release and repeat then do it with each arm seperately Put arm pit on wall and hold 10 seconds.  Consider some lower back exercises See me again in 4 weeks or call me if you want EMG earlier.

## 2015-07-22 NOTE — Progress Notes (Signed)
  Richard ScaleZach Cantrell D.O. [facility] [address] [place], [place] [address] Phone: [phone] Subjective:     CC: Left shoulder pain follow up  OZH:[id number]:Subjective Richard MoraBrandon Cantrell is a 29 y.o. male coming in with complaint of left shoulder pain. Patient was found to have more of scapular dysfunction. Continuing to have difficulties. MRI of patient's shoulder was unremarkable. Patient did have cervical neck x-rays that did not show any bony abnormality. Continues to have the mild discomfort. 2 weeks ago was having some exacerbation that needed some medications. Nothing that is truly stopping him from all activities. Still limiting some of his lifting. Very very tight in the morning. Mild radiation down the arm from time to time. Nothing that stopping him from activities but still can be concerning.     No past medical history on file. No past surgical history on file. Social History  Substance Use Topics  . Smoking status: Never Smoker   . Smokeless tobacco: Never Used  . Alcohol Use: Yes     Comment: infrequently   Allergies  Allergen Reactions  . Amoxicillin Hives   Family History  Problem Relation Age of Onset  . Heart attack Father   . Hypertension Father   . Diabetes Maternal Grandfather   . Prostate cancer Maternal Grandfather   . Diabetes Paternal Grandmother   . Heart attack Paternal Grandfather      Past medical history, social, surgical and family history all reviewed in electronic medical record.   Review of Systems: No headache, visual changes, nausea, vomiting, diarrhea, constipation, dizziness, abdominal pain, skin rash, fevers, chills, night sweats, weight loss, swollen lymph nodes, body aches, joint swelling, muscle aches, chest pain, shortness of breath, mood changes.   Objective Blood pressure 122/72, pulse 93, height 6' (1.829 m), weight 188 lb (85.276 kg), SpO2 99 %.  General: No apparent distress alert and oriented x3 mood and affect normal, dressed  appropriately.  HEENT: Pupils equal, extraocular movements intact  Respiratory: Patient's speak in full sentences and does not appear short of breath  Cardiovascular: No lower extremity edema, non tender, no erythema  Skin: Warm dry intact with no signs of infection or rash on extremities or on axial skeleton.  Abdomen: Soft nontender  Neuro: Cranial nerves II through XII are intact, neurovascularly intact in all extremities with 2+ DTRs and 2+ pulses.  Lymph: No lymphadenopathy of posterior or anterior cervical chain or axillae bilaterally.  Gait normal with good balance and coordination.  MSK:  Non tender with full range of motion and good stability and symmetric strength and tone of  elbows, wrist, hip, knee and ankles bilaterally.  Shoulder: left Inspection reveals no abnormalities, atrophy or asymmetry. Palpation is normal with no tenderness over AC joint or bicipital groove.  Mild tightness trapezius Rotator cuff strength normal throughout. Minimal impingement Speeds and Yergason's tests normal. No labral pathology noted with negative Obrien's, negative clunk and good stability. Improving scapular dyskinesia No painful arc and no drop arm sign. No apprehension sign  Contralateral shoulder unremarkable Osteopathic findings C6 flexed rotated and side bent left T2 extended rotated and side bent left with inhaled second rib T5 extended rotated and side bent right L2 flexed rotated inside that right Sacrum left on left Same pattern as previously   Impression and Recommendations:     This case required medical decision making of moderate complexity.

## 2015-07-22 NOTE — Assessment & Plan Note (Signed)
Patient continues to have some of the difficulty. We discussed stretching the latissimus dorsi a more regular basis. We discussed icing regimen. We discussed which activities do an which ones to potentially avoid. We discussed continuing to work on posture changes. Patient come back and see me again in 3-4 weeks for further evaluation and treatment. If patient has any worsening symptoms we need to consider EMG.

## 2015-07-22 NOTE — Progress Notes (Signed)
Pre visit review using our clinic review tool, if applicable. No additional management support is needed unless otherwise documented below in the visit note. 

## 2015-08-05 ENCOUNTER — Ambulatory Visit (INDEPENDENT_AMBULATORY_CARE_PROVIDER_SITE_OTHER): Payer: BLUE CROSS/BLUE SHIELD | Admitting: Family

## 2015-08-05 ENCOUNTER — Encounter: Payer: Self-pay | Admitting: Family

## 2015-08-05 VITALS — BP 122/86 | HR 84 | Temp 97.8°F | Resp 18 | Ht 72.0 in | Wt 189.4 lb

## 2015-08-05 DIAGNOSIS — M899 Disorder of bone, unspecified: Secondary | ICD-10-CM

## 2015-08-05 DIAGNOSIS — Z0000 Encounter for general adult medical examination without abnormal findings: Secondary | ICD-10-CM

## 2015-08-05 NOTE — Assessment & Plan Note (Signed)
1) Anticipatory Guidance: Discussed importance of wearing a seatbelt while driving and not texting while driving; changing batteries in smoke detector at least once annually; wearing suntan lotion when outside; eating a balanced and moderate diet; getting physical activity at least 30 minutes per day.  2) Immunizations / Screenings / Labs:  All immunizations are up-to-date per recommendations. Due for a dental and vision screen which will be scheduled independently. Declined HIV screening today. All other screenings are up-to-date per recommendations. Declines lab work at this time.  Overall well exam with minimal risk factors for cardiovascular disease and chronic conditions. BMI is slightly elevated at 25.68. Encouraged continued physical activity with goal of 30 minutes daily. Continue current healthy lifestyle choices and behaviors. Does continue to experience some shoulder difficulties and like to be referred to orthopedics. Follow-up prevention exam in one year. Follow-up office visit as needed.

## 2015-08-05 NOTE — Progress Notes (Signed)
Pre visit review using our clinic review tool, if applicable. No additional management support is needed unless otherwise documented below in the visit note. 

## 2015-08-05 NOTE — Patient Instructions (Signed)
Thank you for choosing [facility].  Summary/Instructions:  Health Maintenance, Male A healthy lifestyle and preventative care can promote health and wellness.  Maintain regular health, dental, and eye exams.  Eat a healthy diet. Foods like vegetables, fruits, whole grains, low-fat dairy products, and lean protein foods contain the nutrients you need and are low in calories. Decrease your intake of foods high in solid fats, added sugars, and salt. Get information about a proper diet from your health care provider, if necessary.  Regular physical exercise is one of the most important things you can do for your health. Most adults should get at least 150 minutes of moderate-intensity exercise (any activity that increases your heart rate and causes you to sweat) each week. In addition, most adults need muscle-strengthening exercises on 2 or more days a week.   Maintain a healthy weight. The body mass index (BMI) is a screening tool to identify possible weight problems. It provides an estimate of body fat based on height and weight. Your health care provider can find your BMI and can help you achieve or maintain a healthy weight. For males 20 years and older:  A BMI below 18.5 is considered underweight.  A BMI of 18.5 to 24.9 is normal.  A BMI of 25 to 29.9 is considered overweight.  A BMI of 30 and above is considered obese.  Maintain normal blood lipids and cholesterol by exercising and minimizing your intake of saturated fat. Eat a balanced diet with plenty of fruits and vegetables. Blood tests for lipids and cholesterol should begin at age 20 and be repeated every 5 years. If your lipid or cholesterol levels are high, you are over age 50, or you are at high risk for heart disease, you may need your cholesterol levels checked more frequently.Ongoing high lipid and cholesterol levels should be treated with medicines if diet and exercise are not working.  If you smoke, find out from  your health care provider how to quit. If you do not use tobacco, do not start.  Lung cancer screening is recommended for adults aged 55-80 years who are at high risk for developing lung cancer because of a history of smoking. A yearly low-dose CT scan of the lungs is recommended for people who have at least a 30-pack-year history of smoking and are current smokers or have quit within the past 15 years. A pack year of smoking is smoking an average of 1 pack of cigarettes a day for 1 year (for example, a 30-pack-year history of smoking could mean smoking 1 pack a day for 30 years or 2 packs a day for 15 years). Yearly screening should continue until the smoker has stopped smoking for at least 15 years. Yearly screening should be stopped for people who develop a health problem that would prevent them from having lung cancer treatment.  If you choose to drink alcohol, do not have more than 2 drinks per day. One drink is considered to be 12 oz (360 mL) of beer, 5 oz (150 mL) of wine, or 1.5 oz (45 mL) of liquor.  Avoid the use of street drugs. Do not share needles with anyone. Ask for help if you need support or instructions about stopping the use of drugs.  High blood pressure causes heart disease and increases the risk of stroke. High blood pressure is more likely to develop in:  People who have blood pressure in the end of the normal range (100-139/85-89 mm Hg).  People who are overweight   or obese.  People who are African American.  If you are 18-39 years of age, have your blood pressure checked every 3-5 years. If you are 40 years of age or older, have your blood pressure checked every year. You should have your blood pressure measured twice--once when you are at a hospital or clinic, and once when you are not at a hospital or clinic. Record the average of the two measurements. To check your blood pressure when you are not at a hospital or clinic, you can use:  An automated blood pressure machine  at a pharmacy.  A home blood pressure monitor.  If you are 45-79 years old, ask your health care provider if you should take aspirin to prevent heart disease.  Diabetes screening involves taking a blood sample to check your fasting blood sugar level. This should be done once every 3 years after age 45 if you are at a normal weight and without risk factors for diabetes. Testing should be considered at a younger age or be carried out more frequently if you are overweight and have at least 1 risk factor for diabetes.  Colorectal cancer can be detected and often prevented. Most routine colorectal cancer screening begins at the age of 50 and continues through age 75. However, your health care provider may recommend screening at an earlier age if you have risk factors for colon cancer. On a yearly basis, your health care provider may provide home test kits to check for hidden blood in the stool. A small camera at the end of a tube may be used to directly examine the colon (sigmoidoscopy or colonoscopy) to detect the earliest forms of colorectal cancer. Talk to your health care provider about this at age 50 when routine screening begins. A direct exam of the colon should be repeated every 5-10 years through age 75, unless early forms of precancerous polyps or small growths are found.  People who are at an increased risk for hepatitis B should be screened for this virus. You are considered at high risk for hepatitis B if:  You were born in a country where hepatitis B occurs often. Talk with your health care provider about which countries are considered high risk.  Your parents were born in a high-risk country and you have not received a shot to protect against hepatitis B (hepatitis B vaccine).  You have HIV or AIDS.  You use needles to inject street drugs.  You live with, or have sex with, someone who has hepatitis B.  You are a man who has sex with other men (MSM).  You get hemodialysis  treatment.  You take certain medicines for conditions like cancer, organ transplantation, and autoimmune conditions.  Hepatitis C blood testing is recommended for all people born from 1945 through 1965 and any individual with known risk factors for hepatitis C.  Healthy men should no longer receive prostate-specific antigen (PSA) blood tests as part of routine cancer screening. Talk to your health care provider about prostate cancer screening.  Testicular cancer screening is not recommended for adolescents or adult males who have no symptoms. Screening includes self-exam, a health care provider exam, and other screening tests. Consult with your health care provider about any symptoms you have or any concerns you have about testicular cancer.  Practice safe sex. Use condoms and avoid high-risk sexual practices to reduce the spread of sexually transmitted infections (STIs).  You should be screened for STIs, including gonorrhea and chlamydia if:  You are sexually   active and are younger than 24 years.  You are older than 24 years, and your health care provider tells you that you are at risk for this type of infection.  Your sexual activity has changed since you were last screened, and you are at an increased risk for chlamydia or gonorrhea. Ask your health care provider if you are at risk.  If you are at risk of being infected with HIV, it is recommended that you take a prescription medicine daily to prevent HIV infection. This is called pre-exposure prophylaxis (PrEP). You are considered at risk if:  You are a man who has sex with other men (MSM).  You are a heterosexual man who is sexually active with multiple partners.  You take drugs by injection.  You are sexually active with a partner who has HIV.  Talk with your health care provider about whether you are at high risk of being infected with HIV. If you choose to begin PrEP, you should first be tested for HIV. You should then be tested  every 3 months for as long as you are taking PrEP.  Use sunscreen. Apply sunscreen liberally and repeatedly throughout the day. You should seek shade when your shadow is shorter than you. Protect yourself by wearing long sleeves, pants, a wide-brimmed hat, and sunglasses year round whenever you are outdoors.  Tell your health care provider of new moles or changes in moles, especially if there is a change in shape or color. Also, tell your health care provider if a mole is larger than the size of a pencil eraser.  A one-time screening for abdominal aortic aneurysm (AAA) and surgical repair of large AAAs by ultrasound is recommended for men aged 65-75 years who are current or former smokers.  Stay current with your vaccines (immunizations).   This information is not intended to replace advice given to you by your health care provider. Make sure you discuss any questions you have with your health care provider.   Document Released: 03/10/2008 Document Revised: 10/03/2014 Document Reviewed: 02/07/2011 Elsevier Interactive Patient Education 2016 Elsevier Inc.  

## 2015-08-05 NOTE — Progress Notes (Signed)
Subjective:    Patient ID: Richard Cantrell, male    DOB: 05-Nov-1985, 29 y.o.   MRN: [id number]  Chief Complaint  Patient presents with  . CPE    Not fasting    HPI:  Richard Cantrell is a 29 y.o. male who presents today for an annual wellness visit.   1) Health Maintenance -   Diet - Averages about 3 meals per day consisting of yogurt, eggs, fruits, vegetables, chicken, shrimp, legumes; occasional fast food; 2-3 cups of caffeine daily   Exercise - Infrequent;  Gym 2-3x per week and running a couple of times per week   2) Preventative Exams / Immunizations:  Dental -- Due for exam  Vision -- Due for exam   Health Maintenance  Topic Date Due  . HIV Screening  04/12/2002  . INFLUENZA VACCINE  04/26/2016  . TETANUS/TDAP  04/28/2024    Immunization History  Administered Date(s) Administered  . Influenza,inj,Quad PF,36+ Mos 07/22/2014, 07/22/2015    Allergies  Allergen Reactions  . Amoxicillin Hives     No outpatient prescriptions prior to visit.   No facility-administered medications prior to visit.     Past Medical History  Diagnosis Date  . Scapular dysfunction      History reviewed. No pertinent past surgical history.   Family History  Problem Relation Age of Onset  . Heart attack Father   . Hypertension Father   . Diabetes Maternal Grandfather   . Prostate cancer Maternal Grandfather   . Diabetes Paternal Grandmother   . Heart attack Paternal Grandfather   . Healthy Mother      Social History   Social History  . Marital Status: Single    Spouse Name: N/A  . Number of Children: 0  . Years of Education: 18   Occupational History  . Graduate Assistantship Other    [employer] - [employer] Health   Social History Main Topics  . Smoking status: Never Smoker   . Smokeless tobacco: Never Used  . Alcohol Use: Yes     Comment: infrequently  . Drug Use: No  . Sexual Activity: Not on file   Other Topics Concern  . Not on file   Social  History Narrative   Born and raised in [place]. Lives in an apartment by self. No pets.   Fun: Backpack and camp   Denies any religious beliefs effecting healthcare.       Review of Systems  Constitutional: Denies fever, chills, fatigue, or significant weight gain/loss. HENT: Head: Denies headache or neck pain Ears: Denies changes in hearing, ringing in ears, earache, drainage Nose: Denies discharge, stuffiness, itching, nosebleed, sinus pain Throat: Denies sore throat, hoarseness, dry mouth, sores, thrush Eyes: Denies loss/changes in vision, pain, redness, blurry/double vision, flashing lights Cardiovascular: Denies chest pain/discomfort, tightness, palpitations, shortness of breath with activity, difficulty lying down, swelling, sudden awakening with shortness of breath Respiratory: Denies shortness of breath, cough, sputum production, wheezing Gastrointestinal: Denies dysphasia, heartburn, change in appetite, nausea, change in bowel habits, rectal bleeding, constipation, diarrhea, yellow skin or eyes Genitourinary: Denies frequency, urgency, burning/pain, blood in urine, incontinence, change in urinary strength. Musculoskeletal: Denies muscle/joint pain, stiffness, back pain, redness or swelling of joints, trauma Positive for shoulder pain.  Skin: Denies rashes, lumps, itching, dryness, color changes, or hair/nail changes Neurological: Denies dizziness, fainting, seizures, weakness, numbness, tingling, tremor Psychiatric - Denies nervousness, stress, depression or memory loss Endocrine: Denies heat or cold intolerance, sweating, frequent urination, excessive thirst, changes in appetite Hematologic: Denies  ease of bruising or bleeding     Objective:     BP 122/86 mmHg  Pulse 84  Temp(Src) 97.8 F (36.6 C) (Oral)  Resp 18  Ht 6' (1.829 m)  Wt 189 lb 6.4 oz (85.911 kg)  BMI 25.68 kg/m2  SpO2 98% Nursing note and vital signs reviewed.  Depression screen PHQ 2/9  08/05/2015  Decreased Interest 0  Down, Depressed, Hopeless 0  PHQ - 2 Score 0   Physical Exam  Constitutional: He is oriented to person, place, and time. He appears well-developed and well-nourished.  HENT:  Head: Normocephalic.  Right Ear: Hearing, tympanic membrane, external ear and ear canal normal.  Left Ear: Hearing, tympanic membrane, external ear and ear canal normal.  Nose: Nose normal.  Mouth/Throat: Uvula is midline, oropharynx is clear and moist and mucous membranes are normal.  Eyes: Conjunctivae and EOM are normal. Pupils are equal, round, and reactive to light.  Neck: Neck supple. No JVD present. No tracheal deviation present. No thyromegaly present.  Cardiovascular: Normal rate, regular rhythm, normal heart sounds and intact distal pulses.   Pulmonary/Chest: Effort normal and breath sounds normal.  Abdominal: Soft. Bowel sounds are normal. He exhibits no distension and no mass. There is no tenderness. There is no rebound and no guarding.  Musculoskeletal: Normal range of motion. He exhibits no edema or tenderness.  Lymphadenopathy:    He has no cervical adenopathy.  Neurological: He is alert and oriented to person, place, and time. He has normal reflexes. No cranial nerve deficit. He exhibits normal muscle tone. Coordination normal.  Skin: Skin is warm and dry.  Psychiatric: He has a normal mood and affect. His behavior is normal. Judgment and thought content normal.       Assessment & Plan:   Problem List Items Addressed This Visit      Other   Routine general medical examination at a health care facility - Primary    1) Anticipatory Guidance: Discussed importance of wearing a seatbelt while driving and not texting while driving; changing batteries in smoke detector at least once annually; wearing suntan lotion when outside; eating a balanced and moderate diet; getting physical activity at least 30 minutes per day.  2) Immunizations / Screenings / Labs:  All  immunizations are up-to-date per recommendations. Due for a dental and vision screen which will be scheduled independently. Declined HIV screening today. All other screenings are up-to-date per recommendations. Declines lab work at this time.  Overall well exam with minimal risk factors for cardiovascular disease and chronic conditions. BMI is slightly elevated at 25.68. Encouraged continued physical activity with goal of 30 minutes daily. Continue current healthy lifestyle choices and behaviors. Does continue to experience some shoulder difficulties and like to be referred to orthopedics. Follow-up prevention exam in one year. Follow-up office visit as needed.       Scapular dysfunction   Relevant Orders   AMB referral to orthopedics

## 2015-08-24 ENCOUNTER — Ambulatory Visit (INDEPENDENT_AMBULATORY_CARE_PROVIDER_SITE_OTHER): Payer: BLUE CROSS/BLUE SHIELD | Admitting: Family Medicine

## 2015-08-24 ENCOUNTER — Encounter: Payer: Self-pay | Admitting: Family Medicine

## 2015-08-24 ENCOUNTER — Encounter: Payer: Self-pay | Admitting: *Deleted

## 2015-08-24 VITALS — BP 130/78 | HR 52 | Ht 72.0 in | Wt 187.0 lb

## 2015-08-24 DIAGNOSIS — G540 Brachial plexus disorders: Secondary | ICD-10-CM

## 2015-08-24 DIAGNOSIS — M9901 Segmental and somatic dysfunction of cervical region: Secondary | ICD-10-CM

## 2015-08-24 DIAGNOSIS — M9902 Segmental and somatic dysfunction of thoracic region: Secondary | ICD-10-CM

## 2015-08-24 DIAGNOSIS — M9908 Segmental and somatic dysfunction of rib cage: Secondary | ICD-10-CM

## 2015-08-24 DIAGNOSIS — M999 Biomechanical lesion, unspecified: Secondary | ICD-10-CM

## 2015-08-24 NOTE — Progress Notes (Signed)
Pre visit review using our clinic review tool, if applicable. No additional management support is needed unless otherwise documented below in the visit note. 

## 2015-08-24 NOTE — Assessment & Plan Note (Signed)
Decision today to treat with OMT was based on Physical Exam  After verbal consent patient was treated with HVLA, ME, FPR techniques in cervical, rib, thoracic areas  Patient tolerated the procedure well with improvement in symptoms  Patient given exercises, stretches and lifestyle modifications  See medications in patient instructions if given  Patient will follow up in 4 weeks        

## 2015-08-24 NOTE — Progress Notes (Signed)
  Tawana ScaleZach Oakes Mccready D.O. [facility] [address] [place], [place] [address] Phone: [phone] Subjective:     CC: Left shoulder pain follow up  BJY:NWGNFAOZHYHPI:Subjective Richard MoraBrandon Cantrell is a 29 y.o. male coming in with complaint of left shoulder pain. Patient was found to have more of scapular dysfunction. Continuing to have difficulties. MRI of patient's shoulder was unremarkable. Patient did have cervical neck x-rays that did not show any bony abnormality. We have started treating patient for more of a possible thoracic outlet syndrome. Patient given some stretches. Continue to work on the scapular dysfunction that has been noted previously as well. We have discussed muscle imbalances. No significant improvement overall. Patient continues to have the same discomfort. Patient will be seen another provider for further evaluation.      Past Medical History  Diagnosis Date  . Scapular dysfunction    No past surgical history on file. Social History  Substance Use Topics  . Smoking status: Never Smoker   . Smokeless tobacco: Never Used  . Alcohol Use: Yes     Comment: infrequently   Allergies  Allergen Reactions  . Amoxicillin Hives   Family History  Problem Relation Age of Onset  . Heart attack Father   . Hypertension Father   . Diabetes Maternal Grandfather   . Prostate cancer Maternal Grandfather   . Diabetes Paternal Grandmother   . Heart attack Paternal Grandfather   . Healthy Mother      Past medical history, social, surgical and family history all reviewed in electronic medical record.   Review of Systems: No headache, visual changes, nausea, vomiting, diarrhea, constipation, dizziness, abdominal pain, skin rash, fevers, chills, night sweats, weight loss, swollen lymph nodes, body aches, joint swelling, muscle aches, chest pain, shortness of breath, mood changes.   Objective Blood pressure 130/78, pulse 52, height 6' (1.829 m), weight 187 lb (84.823 kg), SpO2 98 %.    General: No apparent distress alert and oriented x3 mood and affect normal, dressed appropriately.  HEENT: Pupils equal, extraocular movements intact  Respiratory: Patient's speak in full sentences and does not appear short of breath  Cardiovascular: No lower extremity edema, non tender, no erythema  Skin: Warm dry intact with no signs of infection or rash on extremities or on axial skeleton.  Abdomen: Soft nontender  Neuro: Cranial nerves II through XII are intact, neurovascularly intact in all extremities with 2+ DTRs and 2+ pulses.  Lymph: No lymphadenopathy of posterior or anterior cervical chain or axillae bilaterally.  Gait normal with good balance and coordination.  MSK:  Non tender with full range of motion and good stability and symmetric strength and tone of  elbows, wrist, hip, knee and ankles bilaterally.  Shoulder: left Inspection reveals no abnormalities, atrophy or asymmetry. Palpation is normal with no tenderness over AC joint or bicipital groove.  Continued mild tightness of the trapezius Rotator cuff strength normal throughout. Minimal impingement Speeds and Yergason's tests normal. No labral pathology noted with negative Obrien's, negative clunk and good stability. Improving scapular dyskinesia No painful arc and no drop arm sign. No apprehension sign  Contralateral shoulder unremarkable Osteopathic findings C6 flexed rotated and side bent left T2 extended rotated and side bent left with inhaled second rib T5 extended rotated and side bent right L2 flexed rotated inside that right Sacrum left on left Same pattern as previously   Impression and Recommendations:     This case required medical decision making of moderate complexity.

## 2015-08-24 NOTE — Patient Instructions (Addendum)
Good to see you Sorry for no specific answer.  Ice is your friend Stay active Talk to the other doc and see if any other good idea.  We will get EMG of the arm for more info.  See me again in 4-6 weeks.

## 2015-08-24 NOTE — Assessment & Plan Note (Signed)
Patient will have an EMG done. Patient has a negative Spurling's, MRI patient's left shoulder was unremarkable for any tendinopathy or any labral tear. We have attempted one injection in the shoulder with no significant improvement. We discussed proper lifting mechanics. We discussed continuing to stay active. Patient does respond fairly well to osteopathic manipulation. Discussed with patient and will need to continue to monitor. Patient will come back and see me again in 4-6 weeks. Patient didn't want to discuss with another orthopedic physician and will be referred. Patient will see if there is any other findings.

## 2015-11-20 ENCOUNTER — Other Ambulatory Visit: Payer: Self-pay | Admitting: *Deleted

## 2015-11-20 DIAGNOSIS — R200 Anesthesia of skin: Secondary | ICD-10-CM

## 2015-12-01 ENCOUNTER — Ambulatory Visit (INDEPENDENT_AMBULATORY_CARE_PROVIDER_SITE_OTHER): Payer: BLUE CROSS/BLUE SHIELD | Admitting: Neurology

## 2015-12-01 DIAGNOSIS — R200 Anesthesia of skin: Secondary | ICD-10-CM | POA: Diagnosis not present

## 2015-12-01 DIAGNOSIS — M5412 Radiculopathy, cervical region: Secondary | ICD-10-CM

## 2015-12-01 NOTE — Procedures (Signed)
[facility]  [address], [address]  [place], [place] [address] Tel: [phone] Fax:  [phone] Test Date:  12/01/2015  Patient: Richard Cantrell DOB: 10/24/1985 Physician: Nita Sickle, DO  Sex: Male Height: 6' " Ref Phys: Terrilee Files, M.D.  ID#: [id number] Temp: 32.0C Technician: Judie Petit. Dean   Patient Complaints: This is a 29 year old gentleman referred for evaluation of left shoulder pain and numbness involving 4th and 5th digits.  NCV & EMG Findings: Extensive electrodiagnostic testing of the left upper extremity shows: 1. Left median, ulnar, and palmar sensory responses are within normal limits. 2. Left median and ulnar ( recording at both FDI and ADM) motor responses are within normal limits. 3. Mild chronic motor axonal loss changes are seen affecting the C8 myotomes on the left, without accompanied active denervation.  Impression: 1. Chronic C8 radiculopathy affecting the left upper extremity; very mild in degree electrically. 2. There is no evidence of neurogenic thoracic outlet syndrome, ulnar neuropathy or carpal tunnel syndrome in the left upper extremity.   ___________________________ Nita Sickle, DO    Nerve Conduction Studies Anti Sensory Summary Table   Site NR Peak (ms) Norm Peak (ms) P-T Amp (V) Norm P-T Amp  Left Median Anti Sensory (2nd Digit)  Wrist    3.2 <3.3 41.3 >20  Left Ulnar Anti Sensory (5th Digit)  Wrist    2.8 <3.0 33.1 >18   Motor Summary Table   Site NR Onset (ms) Norm Onset (ms) O-P Amp (mV) Norm O-P Amp Site1 Site2 Delta-0 (ms) Dist (cm) Vel (m/s) Norm Vel (m/s)  Left Median Motor (Abd Poll Brev)  Wrist    3.0 <3.9 12.8 >6 Elbow Wrist 4.7 26.0 55 >51  Elbow    7.7  12.8         Left Ulnar Motor (Abd Dig Minimi)  Wrist    2.7 <3.0 11.1 >8 B Elbow Wrist 4.2 24.0 57 >51  B Elbow    6.9  9.8  A Elbow B Elbow 1.7 10.0 59 >51  A Elbow    8.6  9.3         Left Ulnar (FDI) Motor (1st DI)  Wrist    3.3 <3.8 12.3 >8 B Elbow  Wrist 4.6 25.0 54 >51  B Elbow    7.9  11.7  A Elbow B Elbow 1.8 10.0 56 >51  A Elbow    9.7  10.3          Comparison Summary Table   Site NR Peak (ms) Norm Peak (ms) P-T Amp (V) Site1 Site2 Delta-P (ms) Norm Delta (ms)  Left Median/Ulnar Palm Comparison (Wrist - 8cm)  33.9C  Median Palm    1.6 <2.2 58.6 Median Palm Ulnar Palm 0.0   Ulnar Palm    1.6 <2.2 15.5       EMG   Side Muscle Ins Act Fibs Psw Fasc Number Recrt Dur Dur. Amp Amp. Poly Poly. Comment  Left 1stDorInt Nml Nml Nml Nml 1- Rapid Few 1+ Few 1+ Nml Nml N/A  Left Abd Poll Brev Nml Nml Nml Nml Nml Nml Nml Nml Nml Nml Nml Nml N/A  Left FlexPolLong Nml Nml Nml Nml Nml Nml Nml Nml Nml Nml Nml Nml N/A  Left Ext Indicis Nml Nml Nml Nml 1- Rapid Few 1+ Few 1+ Few 1+ N/A  Left PronatorTeres Nml Nml Nml Nml Nml Nml Nml Nml Nml Nml Nml Nml N/A  Left Biceps Nml Nml Nml Nml Nml Nml Nml Nml  Nml Nml Nml Nml N/A  Left Triceps Nml Nml Nml Nml 1- Rapid Few 1+ Few 1+ Nml Nml N/A  Left Deltoid Nml Nml Nml Nml Nml Nml Nml Nml Nml Nml Nml Nml N/A      Waveforms:

## 2016-01-28 IMAGING — CR DG SHOULDER 2+V*L*
3 series · 3 of 3 positions shown · non-contrast
Comparison: None.

CLINICAL DATA: Chronic left shoulder pain for 1 year, possible
weight lifting injury in the past

EXAM:
LEFT SHOULDER - 2+ VIEW

[view not recorded (1 of 3)]
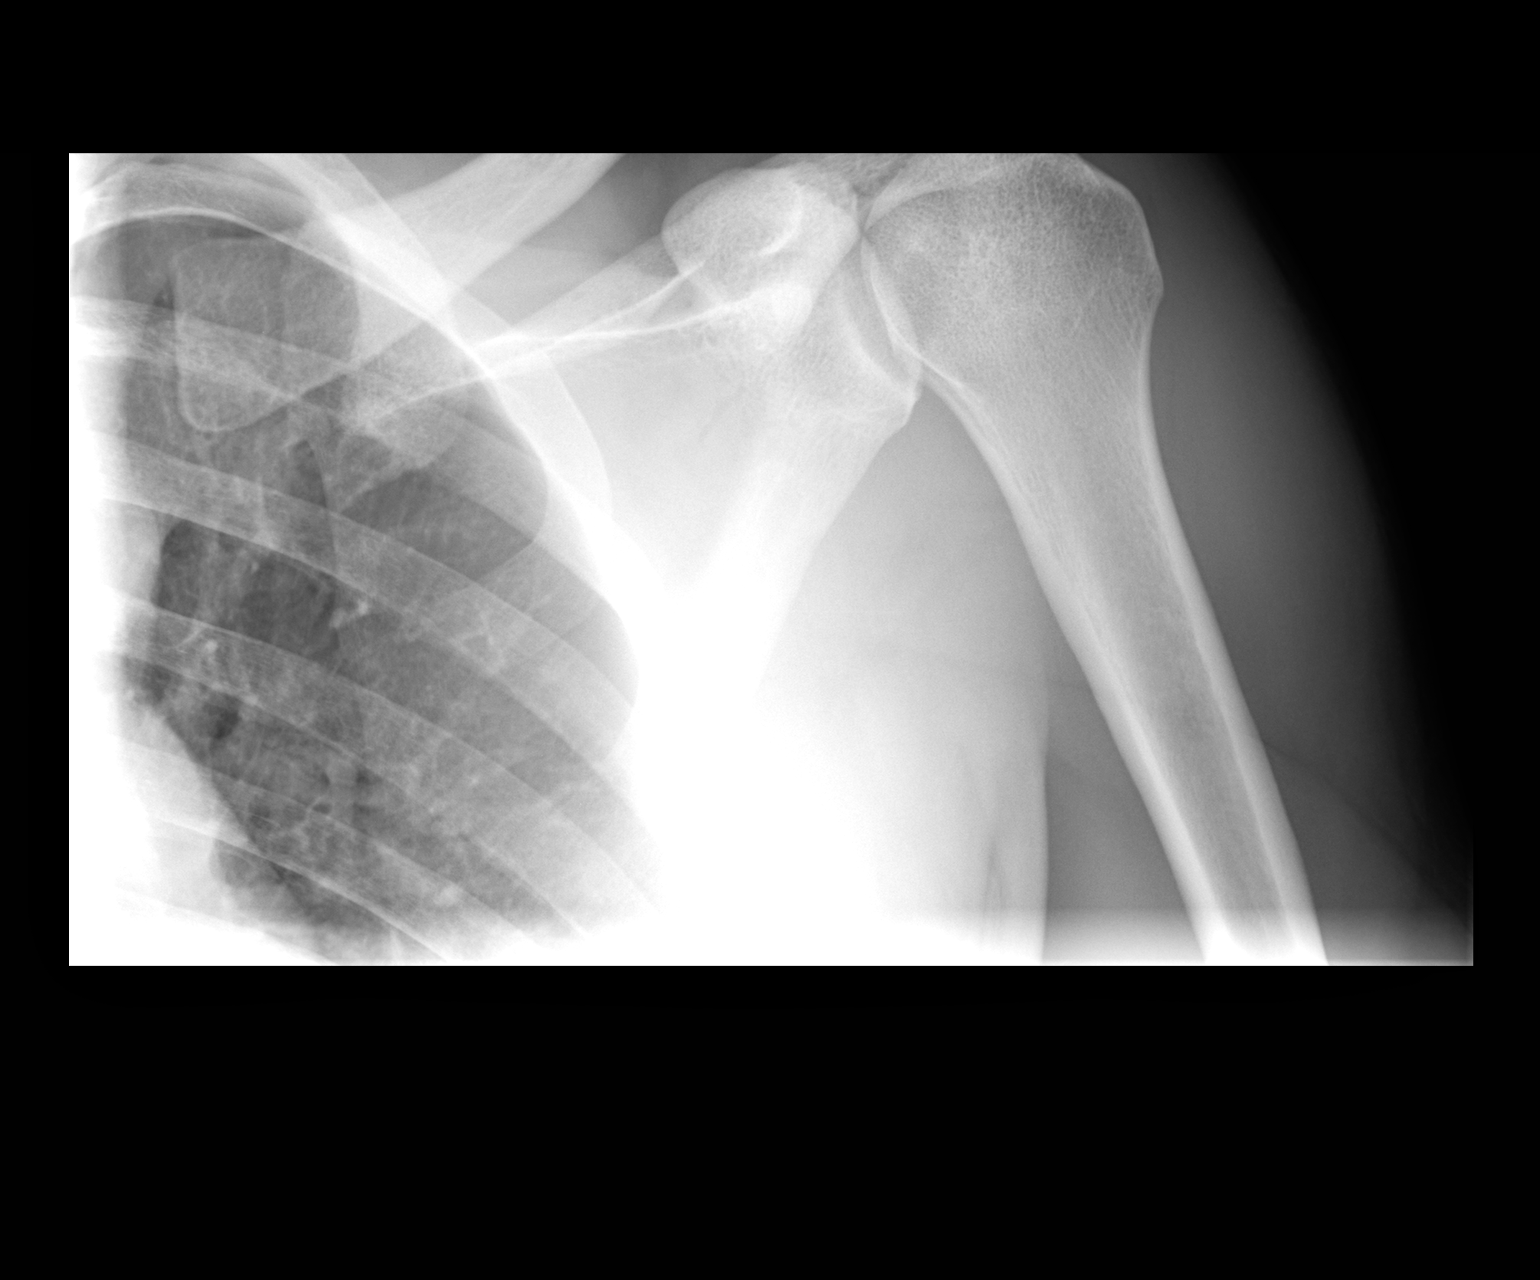

[view not recorded (2 of 3)]
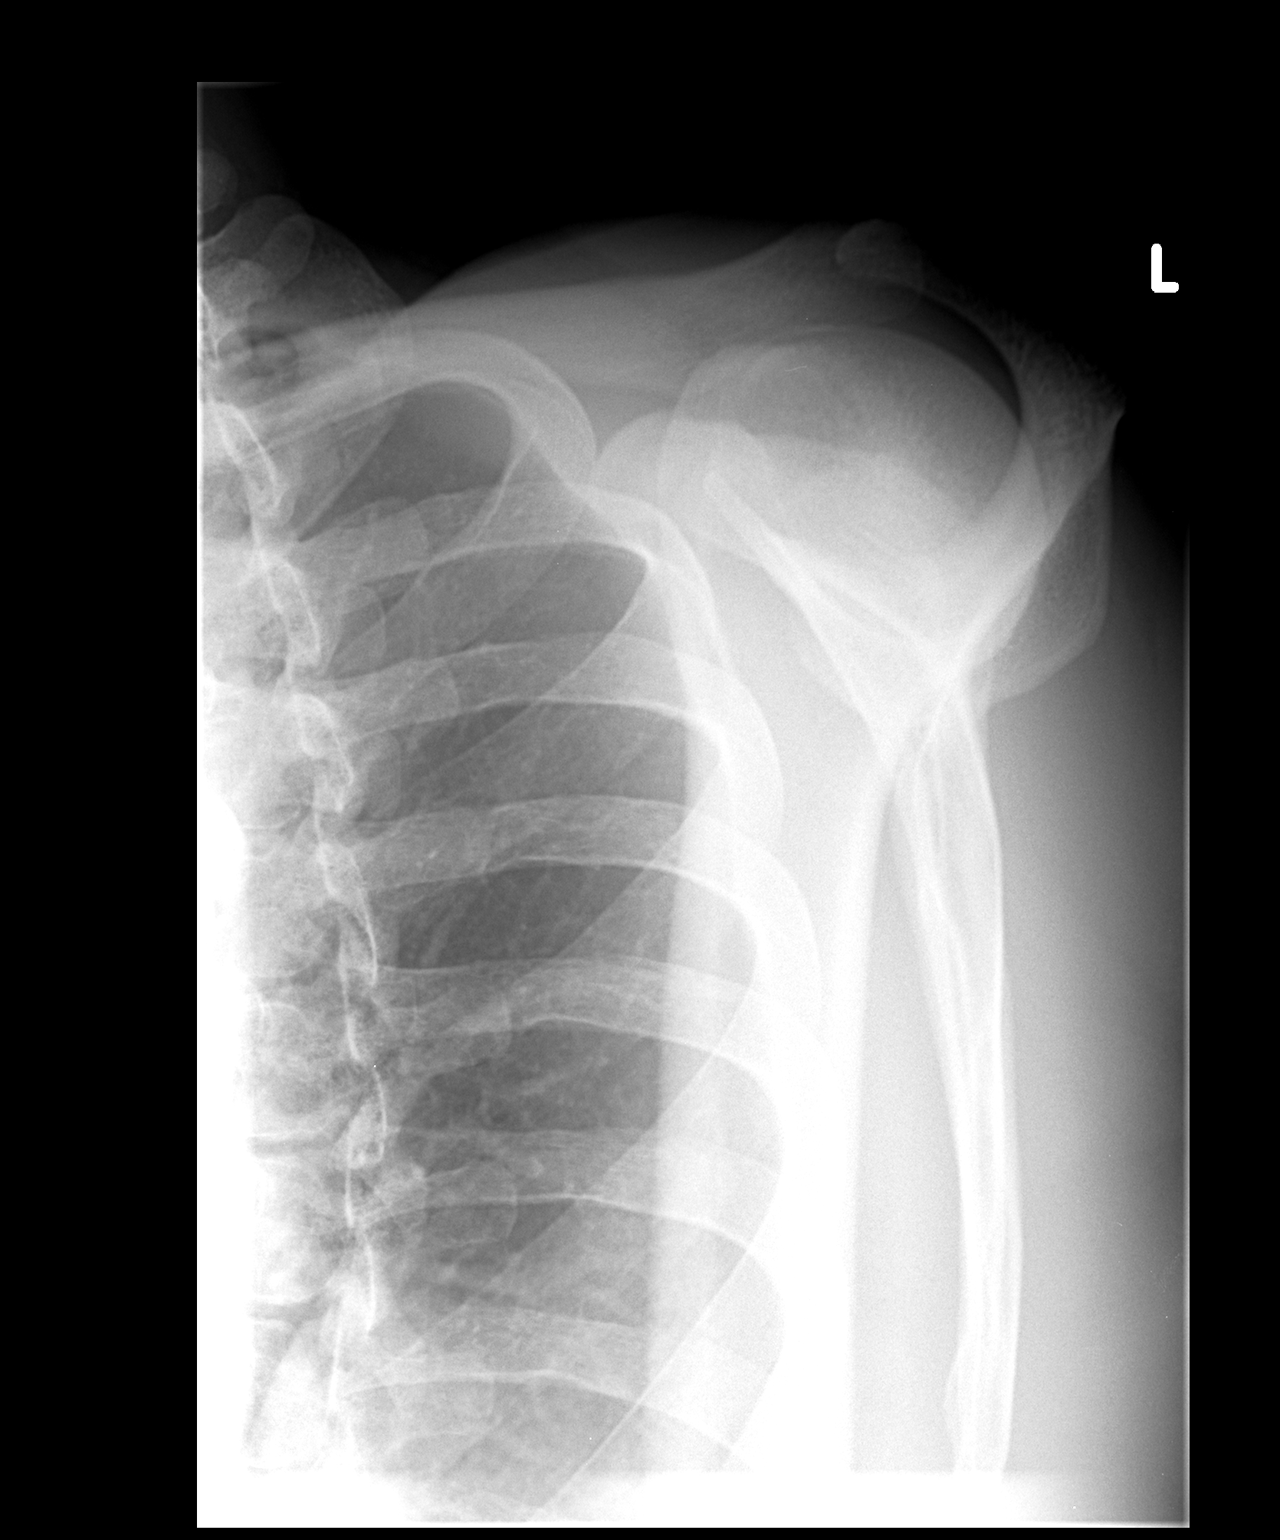

[view not recorded (3 of 3)]
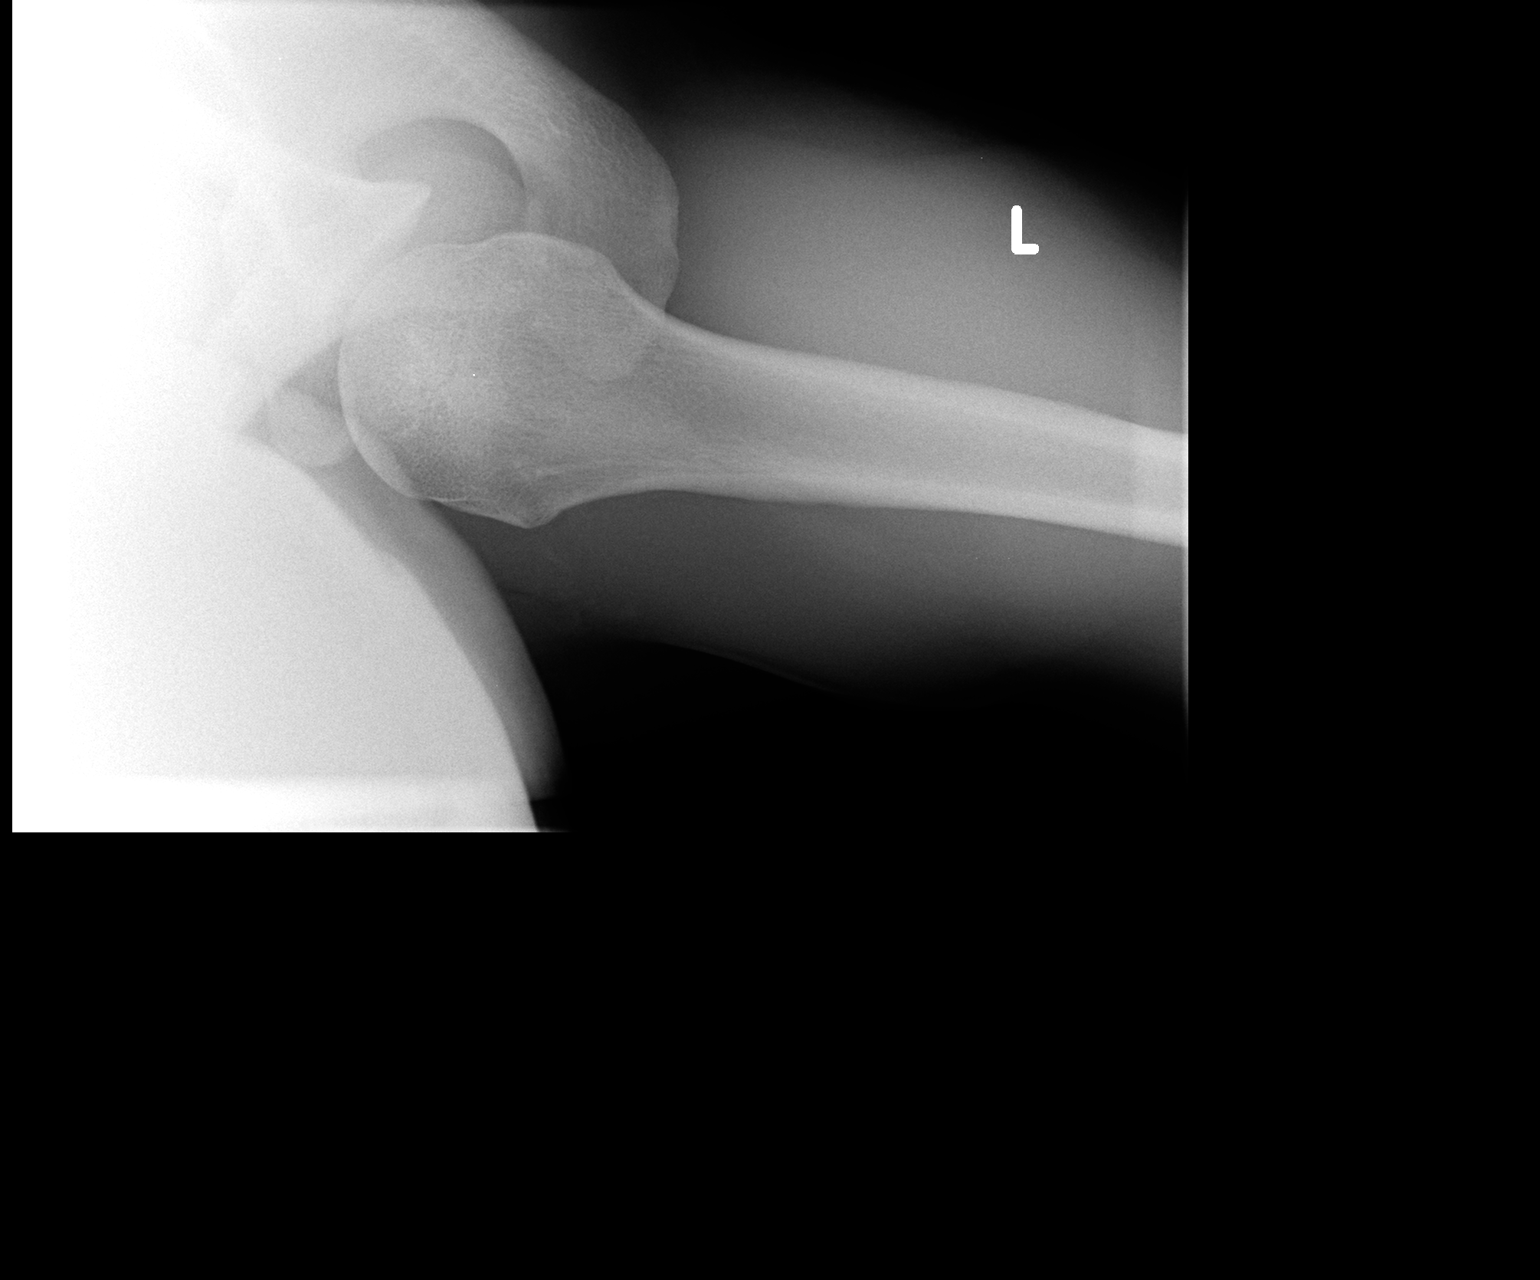

[3 of 3 positions shown; findings below may reference images not displayed]

FINDINGS: There is no evidence of fracture or dislocation. There is no
evidence of arthropathy or other focal bone abnormality. Soft
tissues are unremarkable.
IMPRESSION: No acute abnormality noted.

## 2016-05-25 IMAGING — DX DG CERVICAL SPINE COMPLETE 4+V
5 series · 5 of 5 positions shown · non-contrast
Comparison: None.

CLINICAL DATA: Left neck and shoulder pain

EXAM:
CERVICAL SPINE  4+ VIEWS

[c-spine lat]
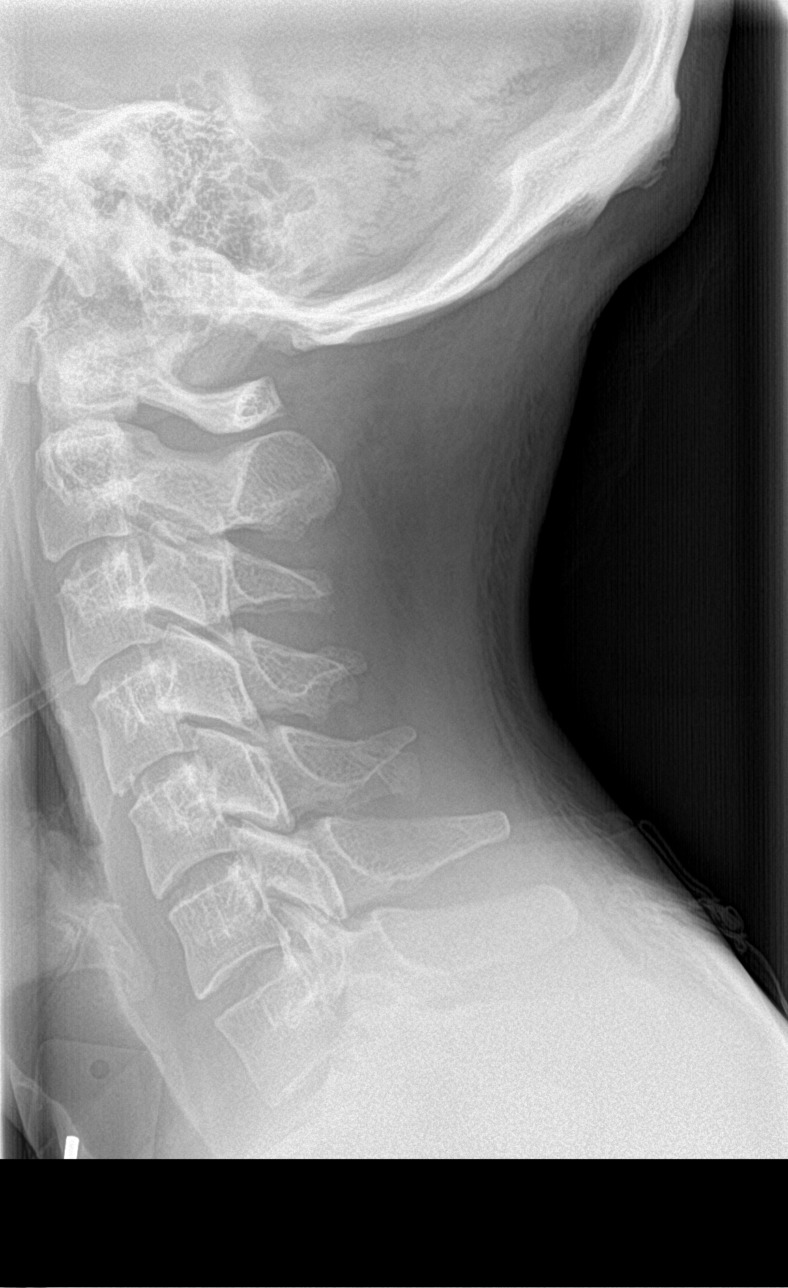

[c-spine obl (1 of 2)]
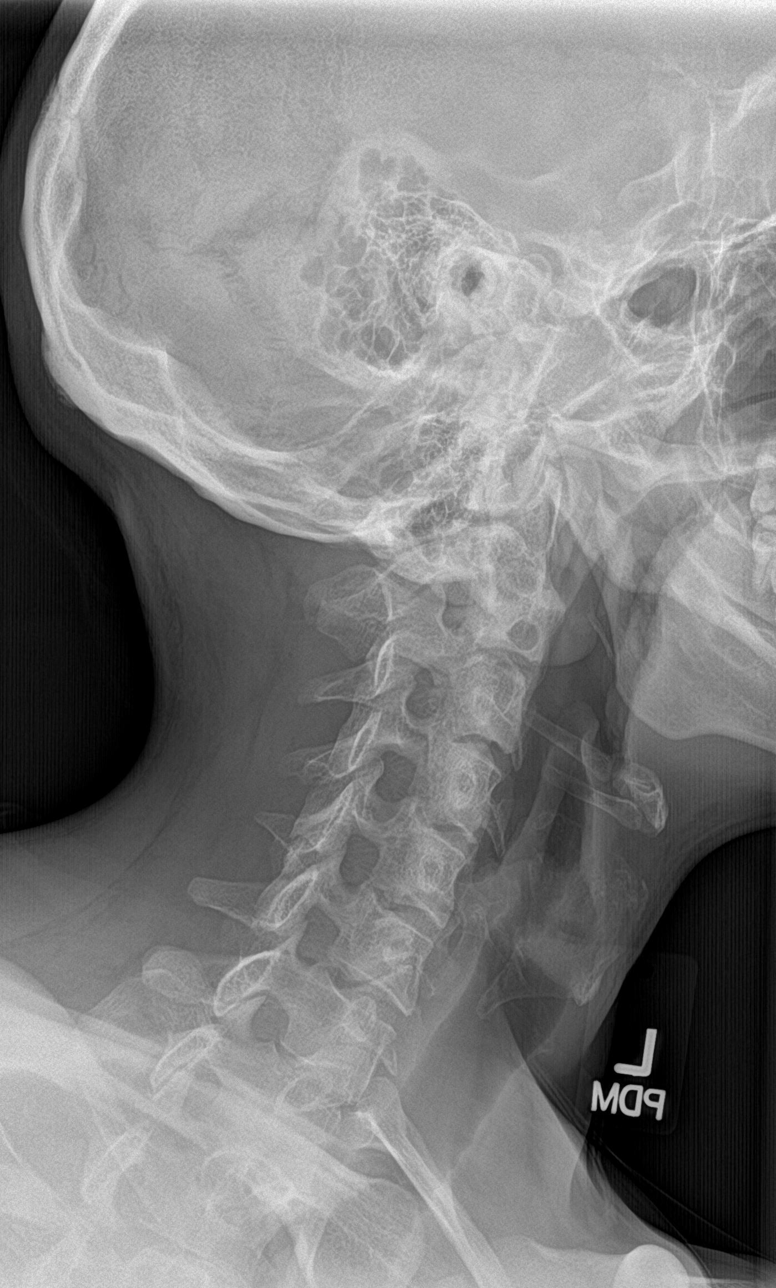

[c-spine obl (2 of 2)]
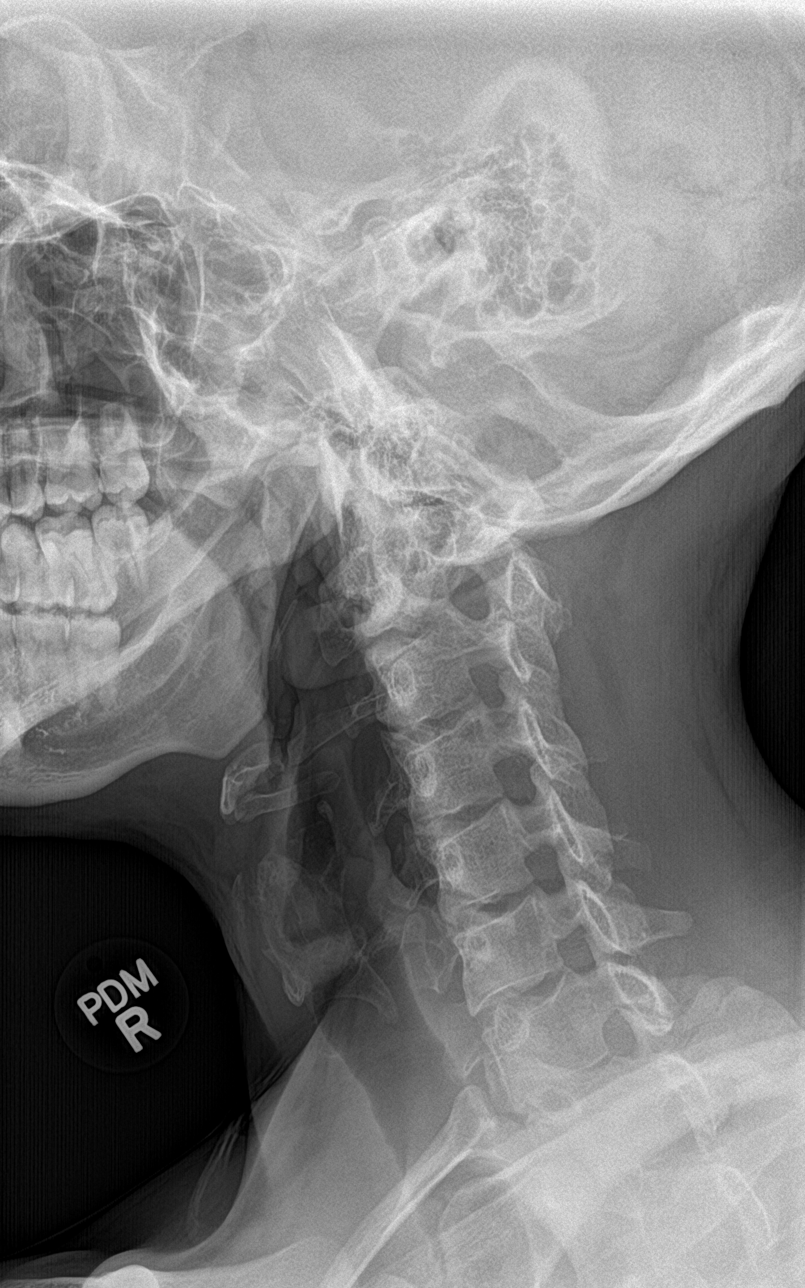

[c-spine ap]
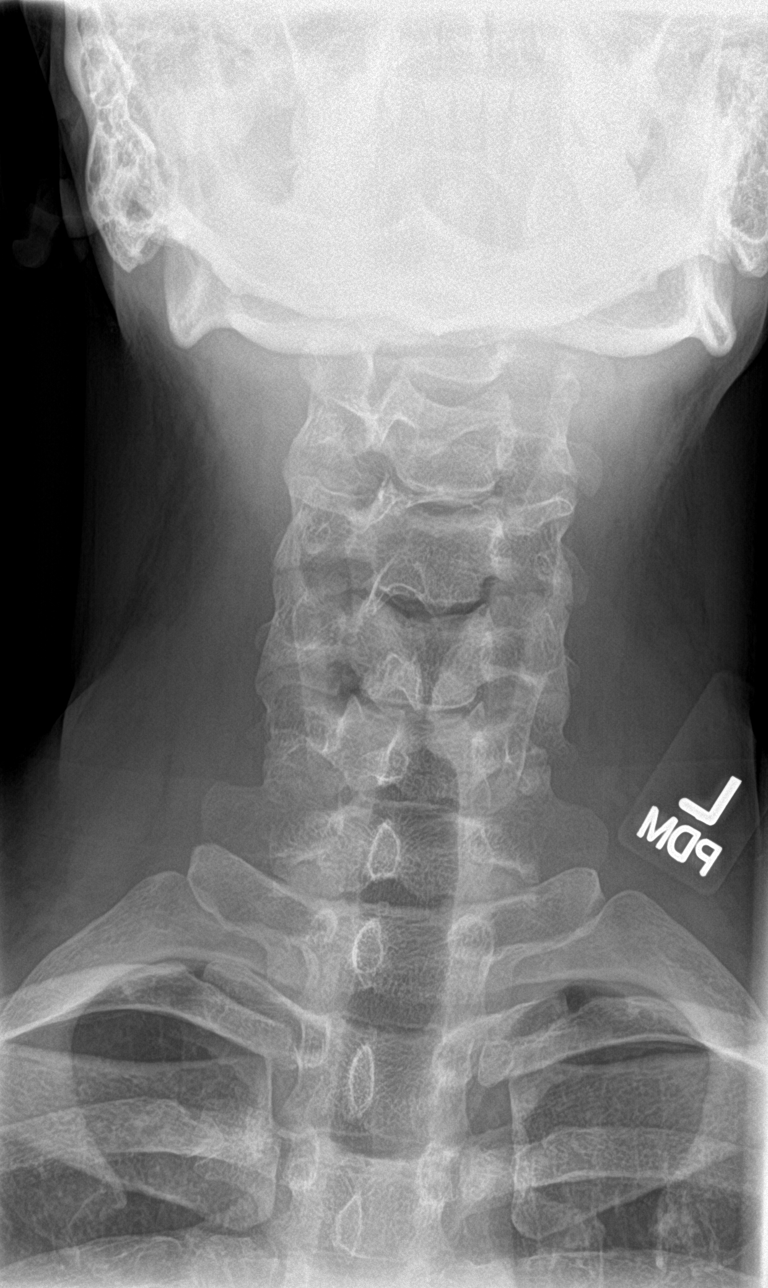

[c-spine open mouth]
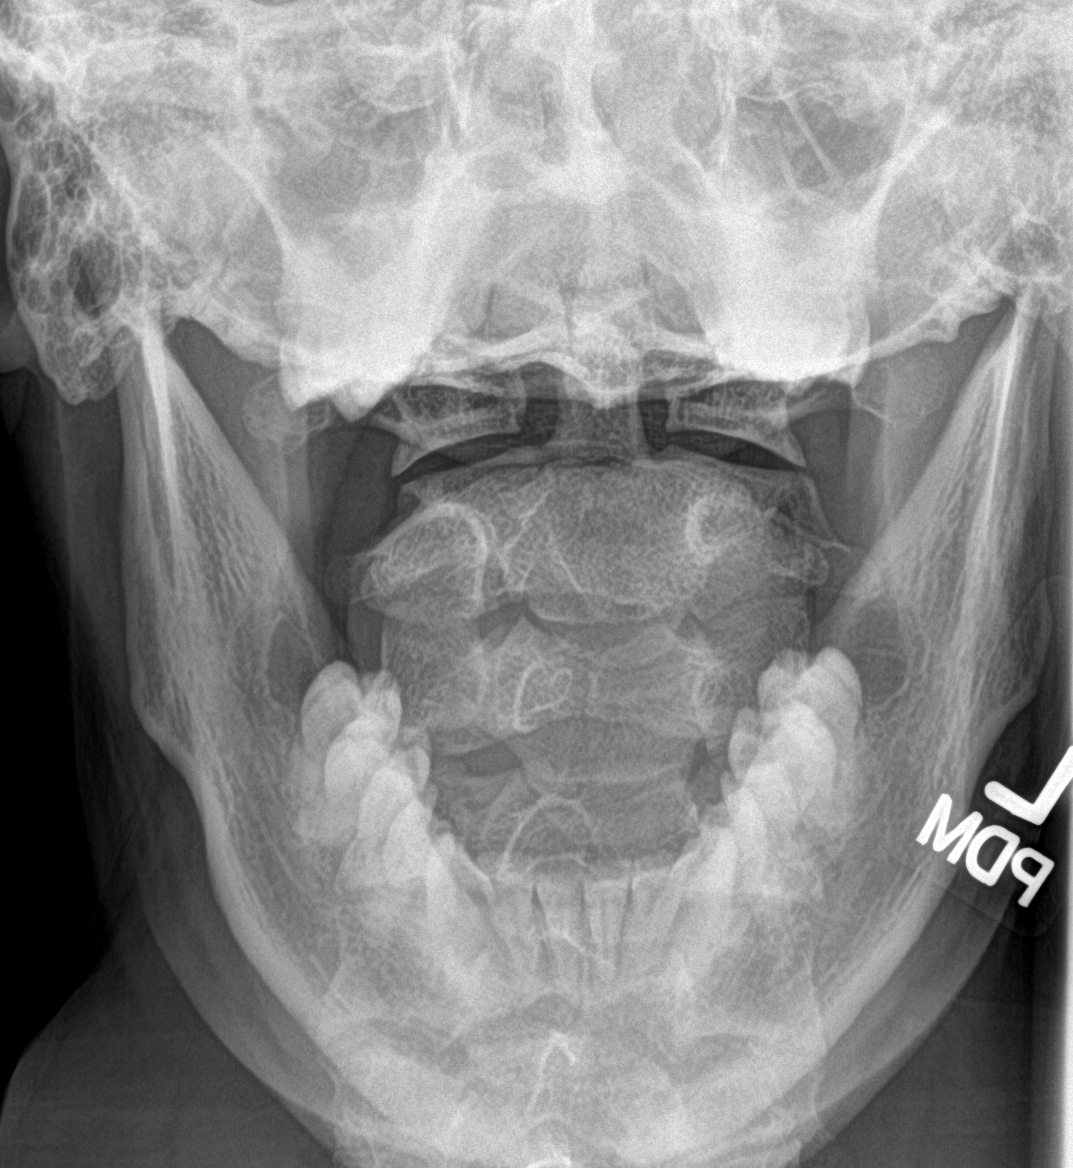

[5 of 5 positions shown; findings below may reference images not displayed]

FINDINGS: Cervical spine is visualized to C7-T1 on the lateral view.

Normal cervical lordosis.

No evidence of fracture or dislocation. Vertebral body heights and
intervertebral disc spaces are maintained. Dens appears intact.
Lateral masses of C1 are symmetric.

No prevertebral soft tissue swelling.

Bilateral neural foramina are patent.

Visualized lung apices are clear.
IMPRESSION: Negative cervical spine radiographs.

## 2016-08-17 ENCOUNTER — Ambulatory Visit (INDEPENDENT_AMBULATORY_CARE_PROVIDER_SITE_OTHER): Payer: BLUE CROSS/BLUE SHIELD

## 2016-08-17 DIAGNOSIS — Z23 Encounter for immunization: Secondary | ICD-10-CM | POA: Diagnosis not present

## 2016-08-24 ENCOUNTER — Encounter: Payer: Self-pay | Admitting: Family

## 2016-08-24 ENCOUNTER — Ambulatory Visit (INDEPENDENT_AMBULATORY_CARE_PROVIDER_SITE_OTHER): Payer: BLUE CROSS/BLUE SHIELD | Admitting: Family

## 2016-08-24 VITALS — BP 108/70 | HR 76 | Temp 98.0°F | Resp 16 | Ht 72.0 in | Wt 197.0 lb

## 2016-08-24 DIAGNOSIS — Z0000 Encounter for general adult medical examination without abnormal findings: Secondary | ICD-10-CM | POA: Diagnosis not present

## 2016-08-24 NOTE — Patient Instructions (Addendum)
Thank you for choosing [facility].  SUMMARY AND INSTRUCTIONS:   Labs:  Please stop by the lab on the lower level of the building for your blood work. Your results will be released to MyChart (or called to you) after review, usually within 72 hours after test completion. If any changes need to be made, you will be notified at that same time.  1.) The lab is open from 7:30am to 5:30 pm Monday-Friday 2.) No appointment is necessary 3.) Fasting (if needed) is 6-8 hours after food and drink; black coffee and water are okay   Follow up:  If your symptoms worsen or fail to improve, please contact our office for further instruction, or in case of emergency go directly to the emergency room at the closest medical facility.    Health Maintenance, Male A healthy lifestyle and preventative care can promote health and wellness.  Maintain regular health, dental, and eye exams.  Eat a healthy diet. Foods like vegetables, fruits, whole grains, low-fat dairy products, and lean protein foods contain the nutrients you need and are low in calories. Decrease your intake of foods high in solid fats, added sugars, and salt. Get information about a proper diet from your health care provider, if necessary.  Regular physical exercise is one of the most important things you can do for your health. Most adults should get at least 150 minutes of moderate-intensity exercise (any activity that increases your heart rate and causes you to sweat) each week. In addition, most adults need muscle-strengthening exercises on 2 or more days a week.   Maintain a healthy weight. The body mass index (BMI) is a screening tool to identify possible weight problems. It provides an estimate of body fat based on height and weight. Your health care provider can find your BMI and can help you achieve or maintain a healthy weight. For males 20 years and older:  A BMI below 18.5 is considered underweight.  A BMI of 18.5 to 24.9  is normal.  A BMI of 25 to 29.9 is considered overweight.  A BMI of 30 and above is considered obese.  Maintain normal blood lipids and cholesterol by exercising and minimizing your intake of saturated fat. Eat a balanced diet with plenty of fruits and vegetables. Blood tests for lipids and cholesterol should begin at age 20 and be repeated every 5 years. If your lipid or cholesterol levels are high, you are over age 50, or you are at high risk for heart disease, you may need your cholesterol levels checked more frequently.Ongoing high lipid and cholesterol levels should be treated with medicines if diet and exercise are not working.  If you smoke, find out from your health care provider how to quit. If you do not use tobacco, do not start.  Lung cancer screening is recommended for adults aged 55-80 years who are at high risk for developing lung cancer because of a history of smoking. A yearly low-dose CT scan of the lungs is recommended for people who have at least a 30-pack-year history of smoking and are current smokers or have quit within the past 15 years. A pack year of smoking is smoking an average of 1 pack of cigarettes a day for 1 year (for example, a 30-pack-year history of smoking could mean smoking 1 pack a day for 30 years or 2 packs a day for 15 years). Yearly screening should continue until the smoker has stopped smoking for at least 15 years. Yearly screening should be   stopped for people who develop a health problem that would prevent them from having lung cancer treatment.  If you choose to drink alcohol, do not have more than 2 drinks per day. One drink is considered to be 12 oz (360 mL) of beer, 5 oz (150 mL) of wine, or 1.5 oz (45 mL) of liquor.  Avoid the use of street drugs. Do not share needles with anyone. Ask for help if you need support or instructions about stopping the use of drugs.  High blood pressure causes heart disease and increases the risk of stroke. High blood  pressure is more likely to develop in:  People who have blood pressure in the end of the normal range (100-139/85-89 mm Hg).  People who are overweight or obese.  People who are African American.  If you are 18-39 years of age, have your blood pressure checked every 3-5 years. If you are 40 years of age or older, have your blood pressure checked every year. You should have your blood pressure measured twice-once when you are at a hospital or clinic, and once when you are not at a hospital or clinic. Record the average of the two measurements. To check your blood pressure when you are not at a hospital or clinic, you can use:  An automated blood pressure machine at a pharmacy.  A home blood pressure monitor.  If you are 45-79 years old, ask your health care provider if you should take aspirin to prevent heart disease.  Diabetes screening involves taking a blood sample to check your fasting blood sugar level. This should be done once every 3 years after age 45 if you are at a normal weight and without risk factors for diabetes. Testing should be considered at a younger age or be carried out more frequently if you are overweight and have at least 1 risk factor for diabetes.  Colorectal cancer can be detected and often prevented. Most routine colorectal cancer screening begins at the age of 50 and continues through age 75. However, your health care provider may recommend screening at an earlier age if you have risk factors for colon cancer. On a yearly basis, your health care provider may provide home test kits to check for hidden blood in the stool. A small camera at the end of a tube may be used to directly examine the colon (sigmoidoscopy or colonoscopy) to detect the earliest forms of colorectal cancer. Talk to your health care provider about this at age 50 when routine screening begins. A direct exam of the colon should be repeated every 5-10 years through age 75, unless early forms of  precancerous polyps or small growths are found.  People who are at an increased risk for hepatitis B should be screened for this virus. You are considered at high risk for hepatitis B if:  You were born in a country where hepatitis B occurs often. Talk with your health care provider about which countries are considered high risk.  Your parents were born in a high-risk country and you have not received a shot to protect against hepatitis B (hepatitis B vaccine).  You have HIV or AIDS.  You use needles to inject street drugs.  You live with, or have sex with, someone who has hepatitis B.  You are a man who has sex with other men (MSM).  You get hemodialysis treatment.  You take certain medicines for conditions like cancer, organ transplantation, and autoimmune conditions.  Hepatitis C blood testing is recommended   for all people born from 1945 through 1965 and any individual with known risk factors for hepatitis C.  Healthy men should no longer receive prostate-specific antigen (PSA) blood tests as part of routine cancer screening. Talk to your health care provider about prostate cancer screening.  Testicular cancer screening is not recommended for adolescents or adult males who have no symptoms. Screening includes self-exam, a health care provider exam, and other screening tests. Consult with your health care provider about any symptoms you have or any concerns you have about testicular cancer.  Practice safe sex. Use condoms and avoid high-risk sexual practices to reduce the spread of sexually transmitted infections (STIs).  You should be screened for STIs, including gonorrhea and chlamydia if:  You are sexually active and are younger than 24 years.  You are older than 24 years, and your health care provider tells you that you are at risk for this type of infection.  Your sexual activity has changed since you were last screened, and you are at an increased risk for chlamydia or  gonorrhea. Ask your health care provider if you are at risk.  If you are at risk of being infected with HIV, it is recommended that you take a prescription medicine daily to prevent HIV infection. This is called pre-exposure prophylaxis (PrEP). You are considered at risk if:  You are a man who has sex with other men (MSM).  You are a heterosexual man who is sexually active with multiple partners.  You take drugs by injection.  You are sexually active with a partner who has HIV.  Talk with your health care provider about whether you are at high risk of being infected with HIV. If you choose to begin PrEP, you should first be tested for HIV. You should then be tested every 3 months for as long as you are taking PrEP.  Use sunscreen. Apply sunscreen liberally and repeatedly throughout the day. You should seek shade when your shadow is shorter than you. Protect yourself by wearing long sleeves, pants, a wide-brimmed hat, and sunglasses year round whenever you are outdoors.  Tell your health care provider of new moles or changes in moles, especially if there is a change in shape or color. Also, tell your health care provider if a mole is larger than the size of a pencil eraser.  A one-time screening for abdominal aortic aneurysm (AAA) and surgical repair of large AAAs by ultrasound is recommended for men aged 65-75 years who are current or former smokers.  Stay current with your vaccines (immunizations). This information is not intended to replace advice given to you by your health care provider. Make sure you discuss any questions you have with your health care provider. Document Released: 03/10/2008 Document Revised: 10/03/2014 Document Reviewed: 06/16/2015 Elsevier Interactive Patient Education  2017 [employer]  

## 2016-08-24 NOTE — Progress Notes (Signed)
Subjective:    Patient ID: Richard Cantrell, male    DOB: 02/11/1986, 30 y.o.   MRN: [id number]  Chief Complaint  Patient presents with  . CPE    not fasting    HPI:  Richard Cantrell is a 30 y.o. male who presents today for an annual wellness visit.   1) Health Maintenance - Overall of good healthy; Recently married in October  Diet - Averages about 3 meals per day consisting of a regular diet; Caffeine intake of about 3-4 per day  Exercise - Less frequent and occasionally to the gym 2x per week; primarily resistance training   2) Preventative Exams / Immunizations:  Dental -- Due for exam  Vision -- Due for exam   Health Maintenance  Topic Date Due  . HIV Screening  04/12/2002  . TETANUS/TDAP  04/28/2024  . INFLUENZA VACCINE  Completed    Immunization History  Administered Date(s) Administered  . Influenza,inj,Quad PF,36+ Mos 07/22/2014, 07/22/2015, 08/17/2016     Allergies  Allergen Reactions  . Amoxicillin Hives     No outpatient prescriptions prior to visit.   No facility-administered medications prior to visit.      Past Medical History:  Diagnosis Date  . Scapular dysfunction      History reviewed. No pertinent surgical history.   Family History  Problem Relation Age of Onset  . Heart attack Father   . Hypertension Father   . Healthy Mother   . Diabetes Maternal Grandfather   . Prostate cancer Maternal Grandfather   . Diabetes Paternal Grandmother   . Heart attack Paternal Grandfather      Social History   Social History  . Marital status: Married    Spouse name: N/A  . Number of children: 0  . Years of education: 5918   Occupational History  . Graduate Assistantship Other    [employer]   Social History Main Topics  . Smoking status: Never Smoker  . Smokeless tobacco: Never Used  . Alcohol use Yes     Comment: infrequently  . Drug use: No  . Sexual activity: Not on file   Other Topics Concern  . Not  on file   Social History Narrative   [place] Lives in an apartment by self. No pets.   Fun: Backpack and camp   Denies any religious beliefs effecting healthcare.       Review of Systems  Constitutional: Denies fever, chills, fatigue, or significant weight gain/loss. HENT: Head: Denies headache or neck pain Ears: Denies changes in hearing, ringing in ears, earache, drainage Nose: Denies discharge, stuffiness, itching, nosebleed, sinus pain Throat: Denies sore throat, hoarseness, dry mouth, sores, thrush Eyes: Denies loss/changes in vision, pain, redness, blurry/double vision, flashing lights Cardiovascular: Denies chest pain/discomfort, tightness, palpitations, shortness of breath with activity, difficulty lying down, swelling, sudden awakening with shortness of breath Respiratory: Denies shortness of breath, cough, sputum production, wheezing Gastrointestinal: Denies dysphasia, heartburn, change in appetite, nausea, change in bowel habits, rectal bleeding, constipation, diarrhea, yellow skin or eyes Genitourinary: Denies frequency, urgency, burning/pain, blood in urine, incontinence, change in urinary strength. Musculoskeletal: Denies muscle/joint pain, stiffness, back pain, redness or swelling of joints, trauma Skin: Denies rashes, lumps, itching, dryness, color changes, or hair/nail changes Neurological: Denies dizziness, fainting, seizures, weakness, numbness, tingling, tremor Psychiatric - Denies nervousness, stress, depression or memory loss Endocrine: Denies heat or cold intolerance, sweating, frequent urination, excessive thirst, changes in appetite Hematologic: Denies ease of bruising or  bleeding     Objective:     BP 108/70 (BP Location: Left Arm, Patient Position: Sitting, Cuff Size: Large)   Pulse 76   Temp 98 F (36.7 C) (Oral)   Resp 16   Ht 6' (1.829 m)   Wt 197 lb (89.4 kg)   SpO2 98%   BMI 26.72 kg/m  Nursing note and vital signs  reviewed.  Physical Exam  Constitutional: He is oriented to person, place, and time. He appears well-developed and well-nourished.  HENT:  Head: Normocephalic.  Right Ear: Hearing, tympanic membrane, external ear and ear canal normal.  Left Ear: Hearing, tympanic membrane, external ear and ear canal normal.  Nose: Nose normal.  Mouth/Throat: Uvula is midline, oropharynx is clear and moist and mucous membranes are normal.  Eyes: Conjunctivae and EOM are normal. Pupils are equal, round, and reactive to light.  Neck: Neck supple. No JVD present. No tracheal deviation present. No thyromegaly present.  Cardiovascular: Normal rate, regular rhythm, normal heart sounds and intact distal pulses.   Pulmonary/Chest: Effort normal and breath sounds normal.  Abdominal: Soft. Bowel sounds are normal. He exhibits no distension and no mass. There is no tenderness. There is no rebound and no guarding.  Musculoskeletal: Normal range of motion. He exhibits no edema or tenderness.  Lymphadenopathy:    He has no cervical adenopathy.  Neurological: He is alert and oriented to person, place, and time. He has normal reflexes. No cranial nerve deficit. He exhibits normal muscle tone. Coordination normal.  Skin: Skin is warm and dry.  Psychiatric: He has a normal mood and affect. His behavior is normal. Judgment and thought content normal.      Assessment & Plan:   Problem List Items Addressed This Visit      Other   Routine general medical examination at a health care facility - Primary    1) Anticipatory Guidance: Discussed importance of wearing a seatbelt while driving and not texting while driving; changing batteries in smoke detector at least once annually; wearing suntan lotion when outside; eating a balanced and moderate diet; getting physical activity at least 30 minutes per day.  2) Immunizations / Screenings / Labs:  All immunizations are up-to-date per recommendations. Due for dental and vision  screen encouraged to be completed independently. All other screenings are up-to-date per recommendations. Obtain CBC, TSH, CMET, and lipid profile.    Overall well exam with minimal risk factors for cardiovascular disease at this time. Recommend continuing physical activity and eating a balanced and varied diet. He recently got married approximately 1-1/2 months ago. Continues to work on his doctoral degree in public health. Continue other healthy lifestyle behaviors and choices. Follow-up prevention exam in 1 year. Follow-up office visit pending blood work if needed.       Relevant Orders   CBC   Comprehensive metabolic panel   Lipid panel   TSH      Follow-up: Return if symptoms worsen or fail to improve.   Jeanine Luzalone, Gregory, FNP

## 2016-08-24 NOTE — Assessment & Plan Note (Signed)
1) Anticipatory Guidance: Discussed importance of wearing a seatbelt while driving and not texting while driving; changing batteries in smoke detector at least once annually; wearing suntan lotion when outside; eating a balanced and moderate diet; getting physical activity at least 30 minutes per day.  2) Immunizations / Screenings / Labs:  All immunizations are up-to-date per recommendations. Due for dental and vision screen encouraged to be completed independently. All other screenings are up-to-date per recommendations. Obtain CBC, TSH, CMET, and lipid profile.    Overall well exam with minimal risk factors for cardiovascular disease at this time. Recommend continuing physical activity and eating a balanced and varied diet. He recently got married approximately 1-1/2 months ago. Continues to work on his doctoral degree in public health. Continue other healthy lifestyle behaviors and choices. Follow-up prevention exam in 1 year. Follow-up office visit pending blood work if needed.

## 2017-08-11 DIAGNOSIS — Z23 Encounter for immunization: Secondary | ICD-10-CM | POA: Diagnosis not present

## 2018-08-20 DIAGNOSIS — Z23 Encounter for immunization: Secondary | ICD-10-CM | POA: Diagnosis not present

## 2019-04-09 ENCOUNTER — Ambulatory Visit (INDEPENDENT_AMBULATORY_CARE_PROVIDER_SITE_OTHER): Payer: BC Managed Care – PPO | Admitting: Family Medicine

## 2019-04-09 ENCOUNTER — Encounter: Payer: Self-pay | Admitting: Family Medicine

## 2019-04-09 VITALS — BP 112/74 | HR 68 | Temp 98.4°F | Resp 16 | Ht 72.5 in | Wt 206.0 lb

## 2019-04-09 DIAGNOSIS — M25512 Pain in left shoulder: Secondary | ICD-10-CM | POA: Diagnosis not present

## 2019-04-09 DIAGNOSIS — Z0000 Encounter for general adult medical examination without abnormal findings: Secondary | ICD-10-CM | POA: Diagnosis not present

## 2019-04-09 DIAGNOSIS — Z13220 Encounter for screening for lipoid disorders: Secondary | ICD-10-CM | POA: Diagnosis not present

## 2019-04-09 DIAGNOSIS — G8929 Other chronic pain: Secondary | ICD-10-CM

## 2019-04-09 NOTE — Assessment & Plan Note (Signed)
-  Chronic with extensive work up previously.  He will plan to see chiropractor and let me know if he has improvement with this.

## 2019-04-09 NOTE — Patient Instructions (Signed)
Great to meet you!    Preventive Care 21-32 Years Old, Male Preventive care refers to lifestyle choices and visits with your health care provider that can promote health and wellness. This includes:  A yearly physical exam. This is also called an annual well check.  Regular dental and eye exams.  Immunizations.  Screening for certain conditions.  Healthy lifestyle choices, such as eating a healthy diet, getting regular exercise, not using drugs or products that contain nicotine and tobacco, and limiting alcohol use. What can I expect for my preventive care visit? Physical exam Your health care provider will check:  Height and weight. These may be used to calculate body mass index (BMI), which is a measurement that tells if you are at a healthy weight.  Heart rate and blood pressure.  Your skin for abnormal spots. Counseling Your health care provider may ask you questions about:  Alcohol, tobacco, and drug use.  Emotional well-being.  Home and relationship well-being.  Sexual activity.  Eating habits.  Work and work environment. What immunizations do I need?  Influenza (flu) vaccine  This is recommended every year. Tetanus, diphtheria, and pertussis (Tdap) vaccine  You may need a Td booster every 10 years. Varicella (chickenpox) vaccine  You may need this vaccine if you have not already been vaccinated. Human papillomavirus (HPV) vaccine  If recommended by your health care provider, you may need three doses over 6 months. Measles, mumps, and rubella (MMR) vaccine  You may need at least one dose of MMR. You may also need a second dose. Meningococcal conjugate (MenACWY) vaccine  One dose is recommended if you are 19-21 years old and a first-year college student living in a residence hall, or if you have one of several medical conditions. You may also need additional booster doses. Pneumococcal conjugate (PCV13) vaccine  You may need this if you have certain  conditions and were not previously vaccinated. Pneumococcal polysaccharide (PPSV23) vaccine  You may need one or two doses if you smoke cigarettes or if you have certain conditions. Hepatitis A vaccine  You may need this if you have certain conditions or if you travel or work in places where you may be exposed to hepatitis A. Hepatitis B vaccine  You may need this if you have certain conditions or if you travel or work in places where you may be exposed to hepatitis B. Haemophilus influenzae type b (Hib) vaccine  You may need this if you have certain risk factors. You may receive vaccines as individual doses or as more than one vaccine together in one shot (combination vaccines). Talk with your health care provider about the risks and benefits of combination vaccines. What tests do I need? Blood tests  Lipid and cholesterol levels. These may be checked every 5 years starting at age 20.  Hepatitis C test.  Hepatitis B test. Screening   Diabetes screening. This is done by checking your blood sugar (glucose) after you have not eaten for a while (fasting).  Sexually transmitted disease (STD) testing. Talk with your health care provider about your test results, treatment options, and if necessary, the need for more tests. Follow these instructions at home: Eating and drinking   Eat a diet that includes fresh fruits and vegetables, whole grains, lean protein, and low-fat dairy products.  Take vitamin and mineral supplements as recommended by your health care provider.  Do not drink alcohol if your health care provider tells you not to drink.  If you drink alcohol: ?   Limit how much you have to 0-2 drinks a day. ? Be aware of how much alcohol is in your drink. In the [place] one drink equals one 12 oz bottle of beer (355 mL), one 5 oz glass of wine (148 mL), or one 1 oz glass of hard liquor (44 mL). Lifestyle  Take daily care of your teeth and gums.  Stay active. Exercise for at  least 30 minutes on 5 or more days each week.  Do not use any products that contain nicotine or tobacco, such as cigarettes, e-cigarettes, and chewing tobacco. If you need help quitting, ask your health care provider.  If you are sexually active, practice safe sex. Use a condom or other form of protection to prevent STIs (sexually transmitted infections). What's next?  Go to your health care provider once a year for a well check visit.  Ask your health care provider how often you should have your eyes and teeth checked.  Stay up to date on all vaccines. This information is not intended to replace advice given to you by your health care provider. Make sure you discuss any questions you have with your health care provider. Document Released: 11/08/2001 Document Revised: 09/06/2018 Document Reviewed: 09/06/2018 Elsevier Patient Education  2020 [employer].

## 2019-04-09 NOTE — Progress Notes (Signed)
Richard Cantrell - 33 y.o. male MRN [id number]  Date of birth: 1986-04-06  Subjective Chief Complaint  Patient presents with  . Annual Exam    CPE/Labs/Lshoulder ?groing area tigthness/tension     HPI Richard Cantrell is a 33 y.o. male here today with for new patient visit and annual exam.  He has had issues with chronic L shoulder pain.  Pain is intermittent, typically only notices during exercise with overhead, pushing or pulling movement.  Has had extensive work up in the past including Xrays, MRI and nerve conduction that were essentially normal.  He has also noticed some groin pain on the L side. He denies numbness, tingling or weakness.  Some increased stomach cramping in gas but no changes to stools.  He follows a healthy diet and exercises regularly.  He is a non-smoker and drinks EtOH socially.   Review of Systems  Constitutional: Negative for chills, fever, malaise/fatigue and weight loss.  HENT: Negative for congestion, ear pain and sore throat.   Eyes: Negative for blurred vision, double vision and pain.  Respiratory: Negative for cough and shortness of breath.   Cardiovascular: Negative for chest pain and palpitations.  Gastrointestinal: Negative for abdominal pain, blood in stool, constipation, heartburn and nausea.  Genitourinary: Negative for dysuria and urgency.  Musculoskeletal: Positive for joint pain. Negative for myalgias.  Neurological: Negative for dizziness and headaches.  Endo/Heme/Allergies: Does not bruise/bleed easily.  Psychiatric/Behavioral: Negative for depression. The patient is not nervous/anxious and does not have insomnia.     Allergies  Allergen Reactions  . Amoxicillin Hives    Past Medical History:  Diagnosis Date  . Scapular dysfunction     History reviewed. No pertinent surgical history.  Social History   Socioeconomic History  . Marital status: Married    Spouse name: Not on file  . Number of children: 0  . Years of education:  37  . Highest education level: Not on file  Occupational History  . Occupation: Scientific laboratory technician: [employer]    Comment: [employer]  . Financial resource strain: Not hard at all  . Food insecurity    Worry: Never true    Inability: Never true  . Transportation needs    Medical: No    Non-medical: No  Tobacco Use  . Smoking status: Never Smoker  . Smokeless tobacco: Never Used  Substance and Sexual Activity  . Alcohol use: Yes    Comment: infrequently  . Drug use: No  . Sexual activity: Yes    Birth control/protection: None  Lifestyle  . Physical activity    Days per week: 5 days    Minutes per session: 60 min  . Stress: Only a little  Relationships  . Social connections    Talks on phone: More than three times a week    Gets together: More than three times a week    Attends religious service: Not on file    Active member of club or organization: Not on file    Attends meetings of clubs or organizations: Not on file    Relationship status: Married  Other Topics Concern  . Not on file  Social History Narrative   Born and raised in [place]. Lives in an apartment with wife. No pets.   Fun: Backpack and camp   Denies any religious beliefs effecting healthcare.     Family History  Problem Relation Age of Onset  . Heart attack Father   .  Hypertension Father   . Healthy Mother   . Diabetes Maternal Grandfather   . Prostate cancer Maternal Grandfather   . Diabetes Paternal Grandmother   . Heart attack Paternal Grandfather     Health Maintenance  Topic Date Due  . HIV Screening  04/12/2002  . INFLUENZA VACCINE  04/27/2019  . TETANUS/TDAP  04/28/2024    ----------------------------------------------------------------------------------------------------------------------------------------------------------------------------------------------------------------- Physical Exam BP 112/74   Pulse 68   Temp 98.4 F (36.9 C)  (Oral)   Resp 16   Ht 6' 0.5" (1.842 m)   Wt 206 lb (93.4 kg)   SpO2 98%   BMI 27.55 kg/m   Physical Exam Constitutional:      General: He is not in acute distress. HENT:     Head: Normocephalic and atraumatic.     Right Ear: External ear normal.     Left Ear: External ear normal.  Eyes:     General: No scleral icterus. Neck:     Musculoskeletal: Normal range of motion.     Thyroid: No thyromegaly.  Cardiovascular:     Rate and Rhythm: Normal rate and regular rhythm.     Heart sounds: Normal heart sounds.  Pulmonary:     Effort: Pulmonary effort is normal.     Breath sounds: Normal breath sounds.  Abdominal:     General: Bowel sounds are normal. There is no distension.     Palpations: Abdomen is soft.     Tenderness: There is no abdominal tenderness. There is no guarding.     Hernia: No hernia is present.  Genitourinary:    Scrotum/Testes: Normal.  Lymphadenopathy:     Cervical: No cervical adenopathy.  Skin:    General: Skin is warm and dry.     Findings: No rash.  Neurological:     Mental Status: He is alert and oriented to person, place, and time.     Cranial Nerves: No cranial nerve deficit.     Motor: No abnormal muscle tone.  Psychiatric:        Behavior: Behavior normal.     ------------------------------------------------------------------------------------------------------------------------------------------------------------------------------------------------------------------- Assessment and Plan  Left shoulder pain -Chronic with extensive work up previously.  He will plan to see chiropractor and let me know if he has improvement with this.   Well adult exam Well adult Orders Placed This Encounter  Procedures  . Comp Met (CMET)  . CBC  . Lipid panel  . TSH  Screening: lipid panel Immunizations: UTD Anticipatory guidance/Risk factor reduction: Encouraged to continue healthy habits with regular exercise and healthy diet.  Additional  recommendations per AVS

## 2019-04-09 NOTE — Assessment & Plan Note (Signed)
Well adult Orders Placed This Encounter  Procedures  . Comp Met (CMET)  . CBC  . Lipid panel  . TSH  Screening: lipid panel Immunizations: UTD Anticipatory guidance/Risk factor reduction: Encouraged to continue healthy habits with regular exercise and healthy diet.  Additional recommendations per AVS

## 2019-04-10 LAB — CBC
HCT: 44.3 % (ref 38.5–50.0)
Hemoglobin: 15.1 g/dL (ref 13.2–17.1)
MCH: 29.5 pg (ref 27.0–33.0)
MCHC: 34.1 g/dL (ref 32.0–36.0)
MCV: 86.5 fL (ref 80.0–100.0)
MPV: 12.2 fL (ref 7.5–12.5)
Platelets: 213 10*3/uL (ref 140–400)
RBC: 5.12 10*6/uL (ref 4.20–5.80)
RDW: 12.8 % (ref 11.0–15.0)
WBC: 5.1 10*3/uL (ref 3.8–10.8)

## 2019-04-10 LAB — COMPREHENSIVE METABOLIC PANEL
AG Ratio: 1.9 (calc) (ref 1.0–2.5)
ALT: 16 U/L (ref 9–46)
AST: 19 U/L (ref 10–40)
Albumin: 4.5 g/dL (ref 3.6–5.1)
Alkaline phosphatase (APISO): 54 U/L (ref 36–130)
BUN: 18 mg/dL (ref 7–25)
CO2: 29 mmol/L (ref 20–32)
Calcium: 9.9 mg/dL (ref 8.6–10.3)
Chloride: 104 mmol/L (ref 98–110)
Creat: 1.3 mg/dL (ref 0.60–1.35)
Globulin: 2.4 g/dL (calc) (ref 1.9–3.7)
Glucose, Bld: 98 mg/dL (ref 65–99)
Potassium: 4.7 mmol/L (ref 3.5–5.3)
Sodium: 140 mmol/L (ref 135–146)
Total Bilirubin: 0.8 mg/dL (ref 0.2–1.2)
Total Protein: 6.9 g/dL (ref 6.1–8.1)

## 2019-04-10 LAB — LIPID PANEL
Cholesterol: 162 mg/dL (ref <200)
HDL: 47 mg/dL (ref >=40)
LDL Cholesterol (Calc): 90 mg/dL (calc)
Non-HDL Cholesterol (Calc): 115 mg/dL (calc) (ref <130)
Total CHOL/HDL Ratio: 3.4 (calc) (ref <5.0)
Triglycerides: 146 mg/dL (ref <150)

## 2019-04-10 LAB — TSH: TSH: 2.53 mIU/L (ref 0.40–4.50)

## 2019-05-30 DIAGNOSIS — Z23 Encounter for immunization: Secondary | ICD-10-CM | POA: Diagnosis not present

## 2019-05-30 DIAGNOSIS — Z111 Encounter for screening for respiratory tuberculosis: Secondary | ICD-10-CM | POA: Diagnosis not present

## 2019-08-18 DIAGNOSIS — Z20828 Contact with and (suspected) exposure to other viral communicable diseases: Secondary | ICD-10-CM | POA: Diagnosis not present

## 2019-09-07 DIAGNOSIS — Z03818 Encounter for observation for suspected exposure to other biological agents ruled out: Secondary | ICD-10-CM | POA: Diagnosis not present

## 2019-09-16 DIAGNOSIS — Z03818 Encounter for observation for suspected exposure to other biological agents ruled out: Secondary | ICD-10-CM | POA: Diagnosis not present

## 2020-04-02 DIAGNOSIS — Z20828 Contact with and (suspected) exposure to other viral communicable diseases: Secondary | ICD-10-CM | POA: Diagnosis not present

## 2020-08-18 DIAGNOSIS — R42 Dizziness and giddiness: Secondary | ICD-10-CM | POA: Diagnosis not present

## 2020-08-18 DIAGNOSIS — M26629 Arthralgia of temporomandibular joint, unspecified side: Secondary | ICD-10-CM | POA: Diagnosis not present
